# Patient Record
Sex: Female | Born: 1970 | Race: Black or African American | Hispanic: No | Marital: Married | Smoking: Former smoker
Health system: Southern US, Community
[De-identification: ages and names within clinical notes are randomized; demographics above are authoritative.]

## PROBLEM LIST (undated history)

## (undated) DIAGNOSIS — Z780 Asymptomatic menopausal state: Secondary | ICD-10-CM

## (undated) DIAGNOSIS — R809 Proteinuria, unspecified: Secondary | ICD-10-CM

## (undated) DIAGNOSIS — F419 Anxiety disorder, unspecified: Secondary | ICD-10-CM

## (undated) HISTORY — PX: CERVICAL BIOPSY  W/ LOOP ELECTRODE EXCISION: SUR135

## (undated) HISTORY — DX: Asymptomatic menopausal state: Z78.0

## (undated) HISTORY — DX: Proteinuria, unspecified: R80.9

## (undated) HISTORY — DX: Anxiety disorder, unspecified: F41.9

---

## 1999-07-08 ENCOUNTER — Ambulatory Visit (HOSPITAL_COMMUNITY): Admission: RE | Admit: 1999-07-08 | Discharge: 1999-07-08 | Payer: Self-pay | Admitting: *Deleted

## 1999-07-08 ENCOUNTER — Encounter: Payer: Self-pay | Admitting: *Deleted

## 1999-08-05 ENCOUNTER — Ambulatory Visit (HOSPITAL_COMMUNITY): Admission: RE | Admit: 1999-08-05 | Discharge: 1999-08-05 | Payer: Self-pay | Admitting: *Deleted

## 1999-08-05 ENCOUNTER — Encounter: Payer: Self-pay | Admitting: *Deleted

## 2000-04-04 ENCOUNTER — Encounter: Admission: RE | Admit: 2000-04-04 | Discharge: 2000-04-04 | Payer: Self-pay | Admitting: Family Medicine

## 2000-06-07 ENCOUNTER — Encounter: Admission: RE | Admit: 2000-06-07 | Discharge: 2000-06-07 | Payer: Self-pay | Admitting: Family Medicine

## 2000-06-28 ENCOUNTER — Encounter: Admission: RE | Admit: 2000-06-28 | Discharge: 2000-06-28 | Payer: Self-pay | Admitting: Family Medicine

## 2001-03-30 ENCOUNTER — Encounter: Admission: RE | Admit: 2001-03-30 | Discharge: 2001-03-30 | Payer: Self-pay | Admitting: Family Medicine

## 2001-10-01 ENCOUNTER — Encounter: Admission: RE | Admit: 2001-10-01 | Discharge: 2001-10-01 | Payer: Self-pay | Admitting: Family Medicine

## 2001-10-08 ENCOUNTER — Encounter: Admission: RE | Admit: 2001-10-08 | Discharge: 2002-01-06 | Payer: Self-pay | Admitting: Family Medicine

## 2001-12-11 ENCOUNTER — Encounter (INDEPENDENT_AMBULATORY_CARE_PROVIDER_SITE_OTHER): Payer: Self-pay | Admitting: *Deleted

## 2001-12-11 ENCOUNTER — Encounter: Admission: RE | Admit: 2001-12-11 | Discharge: 2001-12-11 | Payer: Self-pay | Admitting: Family Medicine

## 2001-12-12 ENCOUNTER — Encounter: Admission: RE | Admit: 2001-12-12 | Discharge: 2001-12-12 | Payer: Self-pay | Admitting: Sports Medicine

## 2001-12-12 ENCOUNTER — Encounter: Payer: Self-pay | Admitting: Sports Medicine

## 2002-01-07 ENCOUNTER — Encounter: Admission: RE | Admit: 2002-01-07 | Discharge: 2002-01-07 | Payer: Self-pay | Admitting: Family Medicine

## 2002-05-29 ENCOUNTER — Encounter: Admission: RE | Admit: 2002-05-29 | Discharge: 2002-05-29 | Payer: Self-pay | Admitting: Family Medicine

## 2003-01-16 ENCOUNTER — Encounter: Admission: RE | Admit: 2003-01-16 | Discharge: 2003-01-16 | Payer: Self-pay | Admitting: Family Medicine

## 2003-05-21 ENCOUNTER — Other Ambulatory Visit: Admission: RE | Admit: 2003-05-21 | Discharge: 2003-05-21 | Payer: Self-pay | Admitting: *Deleted

## 2003-10-14 ENCOUNTER — Encounter: Admission: RE | Admit: 2003-10-14 | Discharge: 2003-10-14 | Payer: Self-pay | Admitting: Family Medicine

## 2004-04-13 ENCOUNTER — Ambulatory Visit: Payer: Self-pay | Admitting: Family Medicine

## 2004-05-18 ENCOUNTER — Ambulatory Visit: Payer: Self-pay | Admitting: Sports Medicine

## 2004-11-11 ENCOUNTER — Ambulatory Visit: Payer: Self-pay | Admitting: Family Medicine

## 2005-02-23 ENCOUNTER — Ambulatory Visit: Payer: Self-pay | Admitting: Family Medicine

## 2005-02-28 ENCOUNTER — Ambulatory Visit: Payer: Self-pay | Admitting: Family Medicine

## 2005-05-25 ENCOUNTER — Ambulatory Visit: Payer: Self-pay | Admitting: Family Medicine

## 2005-08-23 ENCOUNTER — Ambulatory Visit: Payer: Self-pay | Admitting: Family Medicine

## 2006-01-05 ENCOUNTER — Ambulatory Visit: Payer: Self-pay | Admitting: Family Medicine

## 2006-05-08 ENCOUNTER — Encounter (INDEPENDENT_AMBULATORY_CARE_PROVIDER_SITE_OTHER): Payer: Self-pay | Admitting: *Deleted

## 2006-05-08 LAB — CONVERTED CEMR LAB

## 2006-05-12 ENCOUNTER — Ambulatory Visit: Payer: Self-pay | Admitting: Family Medicine

## 2006-05-12 ENCOUNTER — Encounter (INDEPENDENT_AMBULATORY_CARE_PROVIDER_SITE_OTHER): Payer: Self-pay | Admitting: Specialist

## 2006-10-05 DIAGNOSIS — E785 Hyperlipidemia, unspecified: Secondary | ICD-10-CM | POA: Insufficient documentation

## 2006-10-05 DIAGNOSIS — E669 Obesity, unspecified: Secondary | ICD-10-CM | POA: Insufficient documentation

## 2006-10-06 ENCOUNTER — Encounter (INDEPENDENT_AMBULATORY_CARE_PROVIDER_SITE_OTHER): Payer: Self-pay | Admitting: *Deleted

## 2006-10-12 ENCOUNTER — Ambulatory Visit: Payer: Self-pay | Admitting: Family Medicine

## 2006-10-12 ENCOUNTER — Encounter (INDEPENDENT_AMBULATORY_CARE_PROVIDER_SITE_OTHER): Payer: Self-pay | Admitting: Family Medicine

## 2006-10-12 ENCOUNTER — Telehealth (INDEPENDENT_AMBULATORY_CARE_PROVIDER_SITE_OTHER): Payer: Self-pay | Admitting: *Deleted

## 2006-10-12 DIAGNOSIS — E1110 Type 2 diabetes mellitus with ketoacidosis without coma: Secondary | ICD-10-CM | POA: Insufficient documentation

## 2006-10-23 ENCOUNTER — Telehealth (INDEPENDENT_AMBULATORY_CARE_PROVIDER_SITE_OTHER): Payer: Self-pay | Admitting: *Deleted

## 2006-12-06 ENCOUNTER — Telehealth (INDEPENDENT_AMBULATORY_CARE_PROVIDER_SITE_OTHER): Payer: Self-pay | Admitting: *Deleted

## 2006-12-19 ENCOUNTER — Encounter (INDEPENDENT_AMBULATORY_CARE_PROVIDER_SITE_OTHER): Payer: Self-pay | Admitting: Family Medicine

## 2006-12-19 ENCOUNTER — Ambulatory Visit: Payer: Self-pay | Admitting: Family Medicine

## 2006-12-19 LAB — CONVERTED CEMR LAB
ALT: 11 units/L
ALT: 11 units/L (ref 0–35)
AST: 14 units/L
AST: 14 units/L (ref 0–37)
Albumin: 4.2 g/dL
Albumin: 4.2 g/dL (ref 3.5–5.2)
Alkaline Phosphatase: 73 units/L
Alkaline Phosphatase: 73 units/L (ref 39–117)
BUN: 10 mg/dL
BUN: 10 mg/dL (ref 6–23)
CO2, serum: 23 mmol/L
CO2: 23 meq/L (ref 19–32)
Calcium: 9.3 mg/dL
Calcium: 9.3 mg/dL (ref 8.4–10.5)
Chloride, Serum: 108 mmol/L
Chloride: 108 meq/L (ref 96–112)
Cholesterol: 212 mg/dL
Cholesterol: 212 mg/dL — ABNORMAL HIGH (ref 0–200)
Creatinine, Ser: 0.62 mg/dL
Creatinine, Ser: 0.62 mg/dL (ref 0.40–1.20)
Glucose, Bld: 114 mg/dL
Glucose, Bld: 114 mg/dL — ABNORMAL HIGH (ref 70–99)
HDL: 49 mg/dL
HDL: 49 mg/dL (ref >39)
Hgb A1c MFr Bld: 7.5 %
LDL Cholesterol: 140 mg/dL
LDL Cholesterol: 140 mg/dL — ABNORMAL HIGH (ref 0–99)
Potassium, serum: 4.1 mmol/L
Potassium: 4.1 meq/L (ref 3.5–5.3)
Sodium, serum: 141 mmol/L
Sodium: 141 meq/L (ref 135–145)
TSH: 1.057 microintl units/mL
TSH: 1.057 microintl units/mL (ref 0.350–5.50)
Total Bilirubin: 0.6 mg/dL
Total Bilirubin: 0.6 mg/dL (ref 0.3–1.2)
Total CHOL/HDL Ratio: 4.3
Total Protein: 7.5 g/dL
Total Protein: 7.5 g/dL (ref 6.0–8.3)
Triglycerides: 116 mg/dL
Triglycerides: 116 mg/dL (ref <150)
VLDL: 23 mg/dL (ref 0–40)

## 2007-05-03 ENCOUNTER — Ambulatory Visit: Payer: Self-pay | Admitting: Family Medicine

## 2007-05-03 LAB — CONVERTED CEMR LAB: Hgb A1c MFr Bld: 7.6 %

## 2007-09-05 ENCOUNTER — Encounter (INDEPENDENT_AMBULATORY_CARE_PROVIDER_SITE_OTHER): Payer: Self-pay | Admitting: Family Medicine

## 2007-09-05 ENCOUNTER — Ambulatory Visit: Payer: Self-pay | Admitting: Family Medicine

## 2007-09-05 LAB — CONVERTED CEMR LAB
Hgb A1c MFr Bld: 10.7 %
Whiff Test: NEGATIVE

## 2007-09-06 ENCOUNTER — Encounter (INDEPENDENT_AMBULATORY_CARE_PROVIDER_SITE_OTHER): Payer: Self-pay | Admitting: Family Medicine

## 2007-09-06 LAB — CONVERTED CEMR LAB

## 2007-10-02 ENCOUNTER — Encounter: Payer: Self-pay | Admitting: *Deleted

## 2007-10-02 ENCOUNTER — Ambulatory Visit: Payer: Self-pay | Admitting: Family Medicine

## 2007-10-02 LAB — CONVERTED CEMR LAB: Beta hcg, urine, semiquantitative: NEGATIVE

## 2007-10-09 ENCOUNTER — Telehealth: Payer: Self-pay | Admitting: Family Medicine

## 2007-10-10 ENCOUNTER — Encounter: Payer: Self-pay | Admitting: Family Medicine

## 2007-10-15 ENCOUNTER — Encounter (INDEPENDENT_AMBULATORY_CARE_PROVIDER_SITE_OTHER): Payer: Self-pay | Admitting: *Deleted

## 2007-10-17 ENCOUNTER — Encounter (INDEPENDENT_AMBULATORY_CARE_PROVIDER_SITE_OTHER): Payer: Self-pay | Admitting: Family Medicine

## 2007-10-17 ENCOUNTER — Ambulatory Visit: Payer: Self-pay | Admitting: Family Medicine

## 2007-10-17 DIAGNOSIS — F341 Dysthymic disorder: Secondary | ICD-10-CM | POA: Insufficient documentation

## 2007-10-17 LAB — CONVERTED CEMR LAB
Blood in Urine, dipstick: NEGATIVE
Nitrite: NEGATIVE
Specific Gravity, Urine: 1.025
Urobilinogen, UA: 0.2

## 2007-11-06 LAB — CONVERTED CEMR LAB
AST: 11 units/L (ref 0–37)
Albumin: 4.2 g/dL (ref 3.5–5.2)
BUN: 14 mg/dL (ref 6–23)
Calcium: 8.9 mg/dL (ref 8.4–10.5)
Chloride: 104 meq/L (ref 96–112)
HDL: 44 mg/dL (ref >39)
Potassium: 3.8 meq/L (ref 3.5–5.3)
Sodium: 138 meq/L (ref 135–145)
Total Protein: 7.3 g/dL (ref 6.0–8.3)

## 2007-11-09 ENCOUNTER — Ambulatory Visit: Payer: Self-pay | Admitting: Obstetrics & Gynecology

## 2007-11-14 ENCOUNTER — Ambulatory Visit: Payer: Self-pay | Admitting: Obstetrics and Gynecology

## 2007-11-28 ENCOUNTER — Encounter: Payer: Self-pay | Admitting: Obstetrics and Gynecology

## 2007-11-28 ENCOUNTER — Ambulatory Visit (HOSPITAL_COMMUNITY): Admission: RE | Admit: 2007-11-28 | Discharge: 2007-11-28 | Payer: Self-pay | Admitting: Obstetrics and Gynecology

## 2007-11-28 ENCOUNTER — Ambulatory Visit: Payer: Self-pay | Admitting: Obstetrics and Gynecology

## 2007-12-19 ENCOUNTER — Ambulatory Visit: Payer: Self-pay | Admitting: Obstetrics and Gynecology

## 2007-12-27 ENCOUNTER — Ambulatory Visit: Payer: Self-pay | Admitting: Obstetrics & Gynecology

## 2007-12-27 ENCOUNTER — Encounter (INDEPENDENT_AMBULATORY_CARE_PROVIDER_SITE_OTHER): Payer: Self-pay | Admitting: Family Medicine

## 2008-05-16 ENCOUNTER — Ambulatory Visit: Payer: Self-pay | Admitting: Family Medicine

## 2008-05-16 ENCOUNTER — Encounter (INDEPENDENT_AMBULATORY_CARE_PROVIDER_SITE_OTHER): Payer: Self-pay | Admitting: Family Medicine

## 2008-05-16 LAB — CONVERTED CEMR LAB: Hgb A1c MFr Bld: 8.4 %

## 2008-05-18 LAB — CONVERTED CEMR LAB
ALT: 11 units/L (ref 0–35)
AST: 15 units/L (ref 0–37)
Alkaline Phosphatase: 80 units/L (ref 39–117)
Creatinine, Ser: 0.62 mg/dL (ref 0.40–1.20)
LDL Cholesterol: 122 mg/dL — ABNORMAL HIGH (ref 0–99)
Sodium: 138 meq/L (ref 135–145)
Total Bilirubin: 0.7 mg/dL (ref 0.3–1.2)
Total CHOL/HDL Ratio: 4
Total Protein: 7.2 g/dL (ref 6.0–8.3)
VLDL: 10 mg/dL (ref 0–40)

## 2008-05-29 ENCOUNTER — Encounter (INDEPENDENT_AMBULATORY_CARE_PROVIDER_SITE_OTHER): Payer: Self-pay | Admitting: Family Medicine

## 2008-07-11 ENCOUNTER — Encounter (INDEPENDENT_AMBULATORY_CARE_PROVIDER_SITE_OTHER): Payer: Self-pay | Admitting: Family Medicine

## 2008-12-05 LAB — CONVERTED CEMR LAB: Pap Smear: NORMAL

## 2008-12-30 ENCOUNTER — Ambulatory Visit: Payer: Self-pay | Admitting: Family Medicine

## 2008-12-30 ENCOUNTER — Encounter (INDEPENDENT_AMBULATORY_CARE_PROVIDER_SITE_OTHER): Payer: Self-pay | Admitting: Family Medicine

## 2008-12-30 LAB — CONVERTED CEMR LAB

## 2008-12-31 LAB — CONVERTED CEMR LAB
ALT: 10 units/L (ref 0–35)
AST: 15 units/L (ref 0–37)
BUN: 11 mg/dL (ref 6–23)
Creatinine, Ser: 0.57 mg/dL (ref 0.40–1.20)
Direct LDL: 132 mg/dL — ABNORMAL HIGH
Total Bilirubin: 0.6 mg/dL (ref 0.3–1.2)

## 2009-04-28 ENCOUNTER — Encounter: Payer: Self-pay | Admitting: Family Medicine

## 2009-04-28 ENCOUNTER — Ambulatory Visit: Payer: Self-pay | Admitting: Family Medicine

## 2009-04-28 LAB — CONVERTED CEMR LAB
Albumin: 4 g/dL (ref 3.5–5.2)
HDL: 42 mg/dL (ref >39)
LDL Cholesterol: 122 mg/dL — ABNORMAL HIGH (ref 0–99)
Total Protein: 7.3 g/dL (ref 6.0–8.3)
Triglycerides: 68 mg/dL (ref <150)
VLDL: 14 mg/dL (ref 0–40)

## 2009-07-09 ENCOUNTER — Ambulatory Visit: Payer: Self-pay | Admitting: Family Medicine

## 2010-02-23 ENCOUNTER — Telehealth: Payer: Self-pay | Admitting: Family Medicine

## 2010-03-08 ENCOUNTER — Ambulatory Visit: Payer: Self-pay | Admitting: Family Medicine

## 2010-03-08 ENCOUNTER — Encounter: Payer: Self-pay | Admitting: Family Medicine

## 2010-03-08 LAB — CONVERTED CEMR LAB
ALT: 13 units/L (ref 0–35)
AST: 14 units/L (ref 0–37)
Alkaline Phosphatase: 66 units/L (ref 39–117)
Bilirubin Urine: NEGATIVE
CO2: 26 meq/L (ref 19–32)
Hgb A1c MFr Bld: 7.8 %
Ketones, urine, test strip: NEGATIVE
MCV: 84.1 fL (ref 78.0–100.0)
Nitrite: NEGATIVE
Platelets: 242 10*3/uL (ref 150–400)
RDW: 14.5 % (ref 11.5–15.5)
Sodium: 139 meq/L (ref 135–145)
Specific Gravity, Urine: 1.02
TSH: 0.643 microintl units/mL (ref 0.350–4.500)
Total Bilirubin: 0.8 mg/dL (ref 0.3–1.2)
Total Protein: 7 g/dL (ref 6.0–8.3)
WBC: 8.1 10*3/uL (ref 4.0–10.5)

## 2010-03-09 ENCOUNTER — Encounter: Payer: Self-pay | Admitting: Family Medicine

## 2010-03-09 LAB — CONVERTED CEMR LAB: Microalb Creat Ratio: 6.5 mg/g (ref 0.0–30.0)

## 2010-05-27 ENCOUNTER — Ambulatory Visit: Payer: Self-pay | Admitting: Family Medicine

## 2010-05-27 ENCOUNTER — Telehealth: Payer: Self-pay | Admitting: *Deleted

## 2010-05-27 DIAGNOSIS — H9190 Unspecified hearing loss, unspecified ear: Secondary | ICD-10-CM | POA: Insufficient documentation

## 2010-05-27 LAB — CONVERTED CEMR LAB: Pap Smear: NEGATIVE

## 2010-09-07 NOTE — Progress Notes (Signed)
  Phone Note Outgoing Call   Call placed by: Jimmy Footman, CMA,  May 27, 2010 4:04 PM Call placed to: Patient Summary of Call: LVM for patient ot call back office to inform of appt @ ENT. information on the order  Follow-up for Phone Call        spoke with patient informed her of appt Follow-up by: Jimmy Footman, CMA,  May 27, 2010 4:10 PM

## 2010-09-07 NOTE — Assessment & Plan Note (Signed)
Summary: cpe/pap,tcb   Vital Signs:  Patient profile:   40 year old female Height:      66 inches Weight:      180.4 pounds BMI:     29.22 Temp:     98.3 degrees F oral Pulse rate:   80 / minute BP sitting:   129 / 81  (right arm) Cuff size:   regular  Vitals Entered By: Bethena Midget (May 27, 2010 9:22 AM) CC: cpe Is Patient Diabetic? Yes Did you bring your meter with you today? Yes Pain Assessment Patient in pain? no        Primary Care Provider:  Renold Don MD  CC:  cpe.  History of Present Illness: Here for yearly Pap smear:  1. Pap smear:  Last one was 1 year ago at [facility] office Dr. Nicholas Lose.  Does have history of abnormal one.  History of loop surgery.    2.  Difficulty hearing:  Also complains of drainage from ears while she is sleeping.  Complains also of ear blockage.  Has been using Q-tips and ear wax cleaning liquid.  Has history of tympanostomy tubes while a child with decreased hearing.  Now states hearing comes and goes.  Duration of 1 month.    3.  Diabetes:  Tests blood sugars about 1 monthly.  Metformin twice daily.  Has had diabetes since age 49.  Blood sugars have been running about 160s, checks them in AM.    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 Take 1 Tablet By Mouth Every Morning 2)  Enalapril Maleate 5 Mg Tabs (Enalapril Maleate) .... Take 1 Tablet By Mouth Once A Day 3)  Zocor 80 Mg  Tabs (Simvastatin) .Marland Kitchen.. 1 Tablet By Mouth Daily - This Is A New  Dose (You Will Need Repeat Labwork 6-8 Weeks After Starting) 4)  Glucophage 1000 Mg  Tabs (Metformin Hcl) .Marland Kitchen.. 1 Tablet By Mouth Two Times A Day With Food 5)  Glucometer, Test Strips, Lancets .... Test Blood Sugar 1-2 Times Daily - Disp Qs of Each  Allergies (verified): No Known Drug Allergies  Past History:  Past medical, surgical, family and social histories (including risk factors) reviewed, and no changes noted (except as noted below).  Past Medical History: Reviewed history from 12/30/2008 and no changes required. 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 Nicholas Lose  Past Surgical History: Reviewed history from 09/05/2007 and no changes required. Bilateral Tubal Ligation - G5P5 - 01/05/2006  Family History: Reviewed history from 10/17/2007 and no changes required. Mother with diabetes, depression One brother with DM, Bipolar disorder Sister with HTN Two sisters with DM, Depression No family hx of Breast Cancer  Social History: Reviewed history from 07/09/2009 and no changes required. Lives at home with  3 children age 52, 48, 67 and husband.  Oldest, daughter, moving out after graduating [employer] [employer]  ; No tobacco, social alcohol; Works [employer] - 3rd shift; Works at [employer].  Exercising 45 min daily at gym.  Review of Systems       ROS:  no headaches, pre-syncopal or syncopal episodes, chest pain, palpitations, shortness of breath or dyspnea, abdominal pain, diarrhea or constipation, melena, hematochezia, lower extremity swelling.    Physical Exam  General:  Vital signs reviewed. Well-developed, well-nourished patient in NAD.  Awake and cooperative  Ears:  previous tympanostomy tube scars noted.  Right ear within normal limits.  Left ear with fluid behind typanic membrane.   No redness or erythma noted.   Lungs:  clear to auscultation bilaterally without wheezing, rales, or rhonchi.  Normal work of breathing  Heart:  Regular rate and rhythm without murmur, rub, or gallop.  Normal S1/S2  Abdomen:  soft/nondistended/nontender.  Bowel sounds present and normoactive.  No organomegaly noted.   Genitalia:  Pelvic Exam:        External: normal female genitalia without lesions or masses        Vagina: normal without lesions or masses        Cervix: normal without lesions or masses        Adnexa: normal bimanual exam without masses or fullness        Uterus: normal by palpation        Pap smear: performed   Impression & Recommendations:  Problem # 1:  DIABETES-TYPE 2 (ICD-250.00) Assessment Improved A1C much improved.  No changes to medications today.  May eventually refer to Dr. Gerilyn Pilgrim for nutritiont counseling depending on future A1C levels.  Patient deferred today.  Will follow.   Her updated medication list for this problem includes:    Aspirin Ec 81 Mg Tbec (Aspirin) .Marland Kitchen... Take 1 tablet by mouth every morning    Enalapril Maleate 5 Mg Tabs (Enalapril maleate) .Marland Kitchen... Take 1 tablet by mouth once a day    Glucophage 1000 Mg Tabs (Metformin hcl) .Marland Kitchen... 1 tablet by mouth two times a day with food  Orders: [facility]- Est  Level 4 ([address])  Problem # 2:  HEARING DEFICIT (ICD-389.9) Will refer to ENT.  No obvious deformities on exam, but patient with some fluid behind eardrum, possibly chronic in nature.  With history of previous tympanostomy tubes, hearing deficts as a child (questionably resolved per patient?) and new onset hearing deficits, will refer to ENT.   Orders: ENT Referral (ENT)  Problem # 3:  SCREENING FOR MALIGNANT NEOPLASM OF THE CERVIX (ICD-V76.2) Pap performed.  Await results.  Previous CIN I, need yearly paps for now.   Orders: Pap Smear-[facility] ([id number]) [facility]- Est  Level 4 ([address])  Complete Medication List: 1)  Aspirin Ec 81 Mg Tbec (Aspirin) ....  Take 1 tablet by mouth every morning 2)  Enalapril Maleate 5 Mg Tabs (Enalapril maleate) .... Take 1 tablet by mouth once a day 3)  Zocor 80 Mg Tabs (Simvastatin) .Marland Kitchen.. 1 tablet by mouth daily - this is a new dose (you will need repeat labwork 6-8 weeks after starting) 4)  Glucophage 1000 Mg Tabs (Metformin hcl) .Marland Kitchen.. 1 tablet by mouth two times a day with food 5)  Glucometer, Test Strips, Lancets  .... Test blood sugar 1-2 times daily - disp qs of each  Patient Instructions: 1)  Keep working hard at exercise and eating right.   2)  Your last A1C was  7.8, which is MUCH improved over the 9 from last year.  Your ultimate goal would probably be less than 7. 3)  We will let you know the results of your pap smear.   4)  See me again in 3-4 months.  5)  It was good to see you today.    Orders Added: 1)  Pap Smear-[facility] [[id number]] 2)  [facility]- Est  Level 4 [[address]] 3)  ENT Referral [ENT]

## 2010-09-07 NOTE — Letter (Signed)
Summary: Generic Letter  [facility]  [address]   [place], [place] [address]   Phone: [phone]  Fax: [phone]    03/09/2010  Marisa Davis [address] [place], [place]  [address]  Dear [name],  It was good to see you in clinic the other day.    In your labwork, your bloodwork all came back normal.  Your kidney function tests and electrolytes were also normal.  We tested your thyroid function as well, which was also normal.  All this is good.  For your diabetes, your blood sugar was high at 236 and your A1C was 7.8.  This is much lower than your most recent A1C which was 9.  Keep up the hard work with diet and execise as well as taking the Metformin to keep the A1C low.  This will help prevent many of the complications of diabetes we discussed in clinic.  Also, you had no protein in your urine.  This is also very reassuring.    If you have any questions regarding your lab work, please call our office.  Have a good rest of the week.     Sincerely,   Renold Don MD   Appended Document: Generic Letter mailed

## 2010-09-07 NOTE — Progress Notes (Signed)
Summary: Rx Req  Phone Note Call from Patient Call back at [facility] Phone [phone]   Caller: Patient Summary of Call: Wondering if she can get samples of Gluophage. Initial call taken by: Clydell Hakim,  February 23, 2010 10:45 AM  Follow-up for Phone Call        advise patient that we no longer have samples. she then ask for refill to be sent to [facility]. will forward message to MD. Follow-up by: Theresia Lo RN,  February 23, 2010 10:53 AM    Prescriptions: GLUCOPHAGE 1000 MG  TABS (METFORMIN HCL) 1 tablet by mouth two times a day with food  #60 x 3   Entered and Authorized by:   Renold Don MD   Signed by:   Renold Don MD on 02/23/2010   Method used:   Electronically to        [facility] [id number] (retail)       [address] [place], [place]  [address]       Ph: [phone]       Fax: [phone]   RxID:   [id number]   Appended Document: Rx Req Pt informed that refill was sent

## 2010-09-07 NOTE — Assessment & Plan Note (Signed)
Summary: f/up,tcb   Vital Signs:  Patient profile:   40 year old female Height:      66 inches Weight:      181 pounds BMI:     29.32 Temp:     98.6 degrees F oral Pulse rate:   109 / minute BP sitting:   114 / 72  (left arm) Cuff size:   regular  Vitals Entered By: Jimmy Footman, CMA (March 08, 2010 10:55 AM) CC: Needs rx filled Is Patient Diabetic? Yes Did you bring your meter with you today? No Pain Assessment Patient in pain? no        Primary Care Provider:  Renold Don MD  CC:  Needs rx filled.  History of Present Illness: CC:  Patient states pharmacist will not refill medications without doctor visit  1.  Diabetes - Patient states she is prescribed Metformin twice daily but only takes once.  Does not regularly check her blood sugars.  When she does they usually run 150-180s.  Denies dizziness, shakiness, confusion, polyuria, polydipsia, or nocturia.  Denies recent infection or complications.   Attempting to control with diet and regular excercise 3-4 x weekly.    2.  HLD:  No muscle aches or pain.  Taking Lipitor regularly.  No abdominal pain.    3.  Proteinuria:  States she has had this in past.  A1C as high as 14 in past as well.  Would like to have her kidney function tested today.    ROS:  no headaches, pre-syncopal or syncopal episodes, chest pain, palpitations, shortness of breath or dyspnea, abdominal pain, diarrhea or constipation, melena, hematochezia, lower extremity swelling.    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 Take 1 Tablet By Mouth Every Morning 2)  Enalapril Maleate 5 Mg Tabs (Enalapril Maleate) .... Take 1 Tablet By Mouth Once A Day 3)  Zocor 80 Mg  Tabs (Simvastatin) .Marland Kitchen.. 1 Tablet By Mouth Daily - This Is A New Dose (You Will Need Repeat Labwork 6-8 Weeks After Starting) 4)  Glucophage 1000 Mg  Tabs (Metformin Hcl) .Marland Kitchen.. 1 Tablet By Mouth Two Times A Day With Food 5)  Glucometer, Test Strips, Lancets .... Test Blood Sugar 1-2 Times Daily - Disp Qs of Each  Allergies (verified): No Known Drug Allergies  Past History:  Past medical, surgical, family and social histories (including risk factors) reviewed, and no changes noted (except as noted below).  Past Medical History: Reviewed history from 12/30/2008 and no changes required. 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 Nicholas Lose  Past Surgical History: Reviewed history from 09/05/2007 and no changes required. Bilateral Tubal Ligation - G5P5 - 01/05/2006  Family History: Reviewed history from 10/17/2007 and no changes required. Mother with diabetes, depression One  brother with DM, Bipolar disorder Sister with HTN Two sisters with DM, Depression No family hx of Breast Cancer  Social History: Reviewed history from 07/09/2009 and no changes required. Lives at home with 3 children age 51, 79, 49 and husband.  Oldest, daughter, moving out after graduating [employer]  ; No tobacco, social alcohol; Works [employer] - 3rd shift; Works at [employer].  Exercising 45 min daily at gym.  Physical Exam  General:  Vital signs reviewed. Well-developed, well-nourished patient in NAD.  Awake and cooperative  Eyes:  vision grossly intact, pupils equal, pupils round, and pupils reactive to  light.   Mouth:  oral mucosa moist Lungs:  clear to auscultation bilaterally without wheezing, rales, or rhonchi.  Normal work of breathing  Heart:  Regular rate and rhythm.  Grade II/VI systolic ejection murmur heard best at upper sternal borders Abdomen:  soft/nondistended/nontender.  Bowel sounds present and normoactive.  No organomegaly noted.   Pulses:  Bilateral distal pulses present and +2 Extremities:  No clubbing, cyanosis, edema, or deformity noted with normal full range of motion of all joints.  No edema noted bilateral lower extremities.   Neurologic:  No cranial nerve deficits noted. Station and gait are normal. Plantar reflexes are down-going bilaterally. DTRs are symmetrical throughout. Sensory, motor and coordinative functions appear intact.   Impression & Recommendations:  Problem # 1:  DIABETES-TYPE 2 (ICD-250.00) Assessment Improved A1C 7.8 today.  Last recorded A1C was 9.4.  Improvement with diet and exercise per patient.  Recommended she continue to take her Glucophage as directed twice daily.  Denies any abdominal pain or nausea with metformin.  Will check liver function tests as she is on both metformin and statin.  Also CBC. Her updated medication list for this problem includes:    Aspirin Ec 81 Mg Tbec (Aspirin) .Marland Kitchen... Take 1 tablet by mouth every morning    Enalapril Maleate 5 Mg Tabs (Enalapril maleate) .Marland Kitchen... Take 1 tablet by mouth once a day    Glucophage 1000 Mg Tabs (Metformin hcl) .Marland Kitchen... 1 tablet by mouth two times a day with food  Orders: A1C-[facility] ([address]) Comp Met-[facility] ([id number]) CBC-[facility] (9[address]78) TSH-[facility] ([id number]) [facility]- Est  Level 4 ([address])  Problem # 2:  PROTEINURIA (ICD-791.0) Assessment: Comment Only Plan to obtain UA, microalbumin, protein/creatinine ration as patient has history of significant proteinuria in past.  Unable to find history of this in [facility], states it was from previous physician.  Will follow-up with lab results.    Orders: Urinalysis-[facility] (00000) UA Microalbumin-[facility] ([address]) Urine Protein Ratio- [facility] ([id number]) Urine Creatinine-[facility] ([id number]) [facility]- Est  Level 4 ([address])  Problem # 3:  HYPERLIPIDEMIA (ICD-272.4) Plan to check CMET today.  Will follow-up on lipid panel when patient is fasting.   Her updated medication list for this problem includes:    Zocor 80 Mg Tabs (Simvastatin) .Marland Kitchen... 1 tablet by mouth daily - this is a new dose (you will need repeat labwork 6-8 weeks after starting)  Orders: Comp Met-[facility] ([id number]) [facility]- Est  Level 4 ([address])  Complete Medication List: 1)  Aspirin Ec 81 Mg Tbec (Aspirin) .... Take 1 tablet by mouth every morning 2)  Enalapril Maleate 5 Mg Tabs (Enalapril maleate) .... Take 1 tablet by mouth once a day 3)  Zocor 80 Mg Tabs (Simvastatin) .Marland Kitchen.. 1 tablet by mouth daily - this is a new dose (you will need repeat labwork 6-8 weeks after starting) 4)  Glucophage 1000 Mg Tabs (Metformin hcl) .Marland Kitchen.. 1 tablet by mouth  two times a day with food 5)  Glucometer, Test Strips, Lancets  .... Test blood sugar 1-2 times daily - disp qs of each  Patient Instructions: 1)  We are going to check your urine for protein today.   2)  We will also look at your liver and kidney function as well as your A1C.   3)  I will mail these results to you if they are okay. 4)  If you are having problems with just your kidney, we may need to refer you to a kidney doctor. 5)  If you are having problems with your kidneys because of diabetes, we may need to refer you to an endocrinologist (diabetes doctor).   6)  Keep working hard at exercise and eating right, come back in 4-6 months for another check-up. Prescriptions: ENALAPRIL MALEATE 5 MG TABS (ENALAPRIL MALEATE) Take 1 tablet by mouth once a day  #34 x 5   Entered and Authorized by:   Renold Don MD   Signed by:   Renold Don MD on 03/08/2010   Method used:   Electronically to        [facility] [address] (retail)       [address] [place], [place]  [address]       Ph: [phone]       Fax: [phone]   RxID:   [id number] ZOCOR 80 MG  TABS (SIMVASTATIN) 1 tablet by mouth daily - this is a new dose (you will need repeat labwork 6-8 weeks after starting)  #34 x 5   Entered and Authorized by:   Renold Don MD   Signed by:   Renold Don MD on 03/08/2010   Method used:   Electronically to        [facility] [address] (retail)       [address]       [place], [place]  [address]       Ph: [phone]       Fax: [phone]   RxID:   [id number]   Laboratory Results   Urine Tests  Date/Time Received: March 08, 2010 11:10 AM  Date/Time Reported: March 08, 2010 11:19 AM   Routine Urinalysis   Color: yellow Appearance: Clear Glucose: negative   (Normal Range: Negative) Bilirubin: negative   (Normal Range: Negative) Ketone: negative   (Normal Range: Negative) Spec. Gravity: 1.020   (Normal Range: 1.003-1.035) Blood: negative   (Normal Range: Negative) pH: 6.0   (Normal Range: 5.0-8.0) Protein: negative   (Normal Range: Negative) Urobilinogen: 0.2   (Normal Range: 0-1) Nitrite: negative   (Normal Range: Negative) Leukocyte Esterace: negative   (Normal Range: Negative)    Comments: ...............test performed by......Marland KitchenBonnie A. [place], MLS (ASCP)cm   Blood Tests   Date/Time Received: March 08, 2010 11:37 AM  Date/Time Reported: March 08, 2010 1:55 PM   HGBA1C: 7.8%   (Normal Range: Non-Diabetic - 3-6%   Control Diabetic - 6-8%)  Comments: ...............test performed by......Marland KitchenBonnie A. [place], MLS (ASCP)cm        Appended Document: MALBU report    Lab Visit  Laboratory Results   Urine Tests  Date/Time Received: March 09, 2010 11:10 AM  Date/Time Reported: March 09, 2010 2:41 PM   Microalbumin (urine): 30 mg/L Creatinine: 200mg /dL  A:C Ratio <88 Normal  Comments: urine sent for quant MALBU prot/creat  ratio ...............test performed by......Marland KitchenBonnie A. [place], MLS (ASCP)cm    Orders Today: Miscellaneous Lab Charge-[facility] [id number]

## 2010-09-17 ENCOUNTER — Encounter: Payer: Self-pay | Admitting: *Deleted

## 2010-11-04 ENCOUNTER — Ambulatory Visit (INDEPENDENT_AMBULATORY_CARE_PROVIDER_SITE_OTHER): Payer: BC Managed Care – PPO | Admitting: Family Medicine

## 2010-11-04 ENCOUNTER — Encounter: Payer: Self-pay | Admitting: Family Medicine

## 2010-11-04 VITALS — BP 110/78 | HR 64 | Temp 98.4°F | Wt 174.0 lb

## 2010-11-04 DIAGNOSIS — E785 Hyperlipidemia, unspecified: Secondary | ICD-10-CM

## 2010-11-04 DIAGNOSIS — M79604 Pain in right leg: Secondary | ICD-10-CM

## 2010-11-04 DIAGNOSIS — H9190 Unspecified hearing loss, unspecified ear: Secondary | ICD-10-CM

## 2010-11-04 DIAGNOSIS — M79609 Pain in unspecified limb: Secondary | ICD-10-CM

## 2010-11-04 DIAGNOSIS — M25511 Pain in right shoulder: Secondary | ICD-10-CM

## 2010-11-04 DIAGNOSIS — E119 Type 2 diabetes mellitus without complications: Secondary | ICD-10-CM

## 2010-11-04 DIAGNOSIS — M25519 Pain in unspecified shoulder: Secondary | ICD-10-CM

## 2010-11-04 LAB — BASIC METABOLIC PANEL
CO2: 24 mEq/L (ref 19–32)
Calcium: 9.3 mg/dL (ref 8.4–10.5)
Chloride: 104 mEq/L (ref 96–112)
Glucose, Bld: 93 mg/dL (ref 70–99)
Sodium: 139 mEq/L (ref 135–145)

## 2010-11-04 LAB — POCT GLYCOSYLATED HEMOGLOBIN (HGB A1C): Hemoglobin A1C: 7.4

## 2010-11-04 MED ORDER — METFORMIN HCL 1000 MG PO TABS: 1000.0000 mg | ORAL_TABLET | Freq: Two times a day (BID) | ORAL | Status: DC

## 2010-11-04 MED ORDER — ENALAPRIL MALEATE 5 MG PO TABS: 5.0000 mg | ORAL_TABLET | Freq: Every day | ORAL | Status: DC

## 2010-11-04 NOTE — Assessment & Plan Note (Signed)
Diagnosed with "water on the ear" by ENT. Treated with unknown powder prescribed by ENT.   Resolved.

## 2010-11-04 NOTE — Progress Notes (Signed)
  Subjective:    Patient ID: Marisa Davis, female    DOB: 03-Mar-1971, 40 y.o.   MRN: [id number]  HPI 1.  DM II:  Currently on Metformin.  Prescribed Enalapril, but has not been taking this because BP's normal and last microalbumin check was normal. No adverse effects from medications.  No hypoglycemic events.  No paresthesia or peripheral nerve pain.  Does not measure blood sugars at home.   Last A1C was 7.8.  2.  Right hip pain:  Present for several months.  Describes pain in lateral aspect of thigh, near greater trochanter.  Painful at times when she ambulates.  Has not tried anything for relief.  Pain is 3-4/10.  States that pain really is not bad, she just wanted to mention it to make sure it wasn't anything bad.  Described as dull aching pain.  No numbness or tingling.  Not getting better or worse, about the same since it first appeared.    3.  Right shoulder pain:  Also with pain in her right shoulder.  Notices when she tries to lift something heavy.  No weakness. Is not exercising, no recent trauma to explain injury.  Not swollen or red.  Describes as dull ache.  Non-radiating.  Has not tried anything for relief.     Review of Systems See HPI above for review of systems.       Objective:   Physical Exam Gen:  Alert, cooperative patient who appears stated age in no acute distress.  Vital signs reviewed. Cardiac:  Regular rate and rhythm without murmur auscultated.  Good S1/S2. Lungs:  Clear to auscultation bilaterally.  No wheezing, rales, rhonchi noted.   MSK:  Pain when attempting Apley test with Right arm.  Able to "scratch her back" without pain.  No passive range of motion pain, mild tenderness with active ROM.  No strength deficits.  No pain with active/passive ROM testing of RLE.  No numbness, loss of sensation.  No point tenderness on greater trochanter.  No bruising noted.   Neuro:  No focal deficits noted.        Assessment & Plan:

## 2010-11-04 NOTE — Assessment & Plan Note (Addendum)
Reviewed last lipid panel, LDL 122. DMII would place her at LDL goal <100. Patient would like to attempt trial of better eating and exercise before adding medications. She was on statin in past, no longer taking.   FU in 6 months, will obtain FLP at that time.

## 2010-11-04 NOTE — Assessment & Plan Note (Signed)
As with leg pain, to continue exercise. Discussed she does not want to limit movement to prevent frozen shoulder in diabetic.   If worsening or no improvement, to call or return to clinic.

## 2010-11-04 NOTE — Patient Instructions (Signed)
Keep taking the Metformin. I'll send in the Enalapril to [facility]. We'll check your labwork today. Your A1C was 7.4 today.  Your goal should be below 7.   For your shoulder, make sure you keep exercising and moving it.

## 2010-11-05 ENCOUNTER — Telehealth: Payer: Self-pay | Admitting: *Deleted

## 2010-11-05 ENCOUNTER — Encounter: Payer: Self-pay | Admitting: Family Medicine

## 2010-11-05 NOTE — Telephone Encounter (Signed)
Received fax from pharmacy  . MD sent in RX for metformin 1000 mg twice daily but only sent in # 30 tabs per month . called pharmacy and advised may give # 60 tabs.

## 2010-12-21 NOTE — Op Note (Signed)
NAME:  Marisa Davis, Marisa Davis               ACCOUNT NO.:  [id number]   MEDICAL RECORD NO.:  [id number]          PATIENT TYPE:  AMB   LOCATION:  SDC                           FACILITY:  [facility]   PHYSICIAN:  Phil D. Okey Dupre, M.D.     DATE OF BIRTH:  March 27, 1971   DATE OF PROCEDURE:  11/28/2007  DATE OF DISCHARGE:                               OPERATIVE REPORT   PROCEDURE:  Cold knife conization of the cervix.   PREOPERATIVE DIAGNOSIS:  Severe cervical dysplasia, CIN II-III.   POSTOPERATIVE DIAGNOSIS:  Pending pathology report.   SURGEON:  Dr. Okey Dupre.   ANESTHESIA:  General plus local.   SPECIMENS TO PATHOLOGY:  Cervical cone biopsy.   ESTIMATED BLOOD LOSS:  20 mL.   POSTOPERATIVE CONDITION:  Satisfactory.   DESCRIPTION OF PROCEDURE:  Under satisfactory general anesthesia with  the patient in the dorsal lithotomy position, the perineum and vagina  were prepped and draped in the usual sterile manner.  Bimanual pelvic  examination under anesthesia revealed the uterus of normal size, shape  and consistency.  Adnexa was normal.  A weighted speculum was placed in  the posterior fourchette of the vagina through a marital introitus.  BUS  within normal limits.  Vagina is clean and well rugated.  The cervix was  parous with an inflamed area around the entire cervical parous os,  approximately 1/2 cm from the opening into the external os.  Ten mL of  1% Xylocaine was injected into each of the paracervical areas at 8 and 4  o'clock for further patient comfort postop and #1 chromic catgut suture  figure-of-eights were placed in each of the lateral angles of the  cervix, approximately 3 cm from the distal end for hemostasis and  traction.  These were held long.  The entire circumference of the cervix  then just above those approximately 3 cm from the distal end, we  superficially ran a pursestring starting at 12 o'clock and going around  with #1 chromic very superficially to 12 o'clock coming around the  entire cervix.  This was snugly tied down and cut short.  The cervical  cone was then taken with a #11 knife blade 0.5 cm from the external  cervical os and extending up about 1.5 cm into the cervix.  The area  appeared dry but was run with a continuous running locked #1 chromic  baseball type of stitch around the entire circumference.  Once this was  done, that was cut short as were the  original figure-of-eight sutures that were held for traction.  A small  piece of Surgicel was placed up in the cervix.  No bleeding occurred at  the end of procedure.  The total blood loss was about 20 mL.  The  patient transferred to the recovery room in satisfactory condition  having tolerated the procedure well.      Phil D. Okey Dupre, M.D.  Electronically Signed     PDR/MEDQ  D:  11/28/2007  T:  11/28/2007  Job:  [id number]

## 2010-12-21 NOTE — Group Therapy Note (Signed)
NAME:  Marisa Davis, Marisa Davis NO.:  [id number]   MEDICAL RECORD NO.:  [id number]          PATIENT TYPE:  WOC   LOCATION:  [facility]                   FACILITY:  [facility]   PHYSICIAN:  Argentina Donovan, MD        DATE OF BIRTH:  August 02, 1971   DATE OF SERVICE:  11/14/2007                                  CLINIC NOTE   The patient is a 40 year old gravida 3, para 3-00-3 with severe  dysplasia who came in for a LEEP procedure.  On examination the cervix  lesion was too large to safely do in the clinic, and I am going to  schedule her to do this in the operating room as a conization.           ______________________________  Argentina Donovan, MD     PR/MEDQ  D:  11/14/2007  T:  11/14/2007  Job:  [id number]

## 2010-12-27 ENCOUNTER — Encounter: Payer: Self-pay | Admitting: Family Medicine

## 2011-04-19 ENCOUNTER — Telehealth: Payer: Self-pay | Admitting: Family Medicine

## 2011-04-19 NOTE — Telephone Encounter (Signed)
Please call patient back with results.

## 2011-04-19 NOTE — Telephone Encounter (Signed)
LVM for patient to call back. Her last A1C was 11/04/2010 and it was 7.4. Patient is due for A1C again she has not had one drawn in 6 months nor has had an office visit since then also

## 2011-04-21 NOTE — Telephone Encounter (Signed)
LVM for patient to call back. ?

## 2011-04-25 NOTE — Telephone Encounter (Signed)
LVM informing her of the message below

## 2011-05-03 LAB — CBC
Hemoglobin: 12
RBC: 4.8
WBC: 6.4

## 2011-05-03 LAB — URINALYSIS, ROUTINE W REFLEX MICROSCOPIC
Glucose, UA: NEGATIVE
Nitrite: NEGATIVE
Specific Gravity, Urine: >1.03 — ABNORMAL HIGH
pH: 6

## 2011-05-03 LAB — BASIC METABOLIC PANEL
Chloride: 104
Creatinine, Ser: 0.48
GFR calc Af Amer: >60
GFR calc non Af Amer: >60
Potassium: 3.6

## 2011-05-16 ENCOUNTER — Ambulatory Visit (INDEPENDENT_AMBULATORY_CARE_PROVIDER_SITE_OTHER): Payer: BC Managed Care – PPO | Admitting: Family Medicine

## 2011-05-16 ENCOUNTER — Other Ambulatory Visit (HOSPITAL_COMMUNITY)
Admission: RE | Admit: 2011-05-16 | Discharge: 2011-05-16 | Disposition: A | Discharge status: Home / Self Care | Payer: BC Managed Care – PPO | Source: Ambulatory Visit | Attending: Family Medicine | Admitting: Family Medicine

## 2011-05-16 ENCOUNTER — Encounter: Payer: Self-pay | Admitting: Family Medicine

## 2011-05-16 VITALS — BP 119/78 | HR 80 | Temp 97.4°F | Ht 66.0 in | Wt 172.5 lb

## 2011-05-16 DIAGNOSIS — Z01419 Encounter for gynecological examination (general) (routine) without abnormal findings: Secondary | ICD-10-CM | POA: Insufficient documentation

## 2011-05-16 DIAGNOSIS — E119 Type 2 diabetes mellitus without complications: Secondary | ICD-10-CM

## 2011-05-16 DIAGNOSIS — Z124 Encounter for screening for malignant neoplasm of cervix: Secondary | ICD-10-CM

## 2011-05-16 DIAGNOSIS — M79609 Pain in unspecified limb: Secondary | ICD-10-CM

## 2011-05-16 DIAGNOSIS — M76899 Other specified enthesopathies of unspecified lower limb, excluding foot: Secondary | ICD-10-CM

## 2011-05-16 DIAGNOSIS — M79604 Pain in right leg: Secondary | ICD-10-CM

## 2011-05-16 DIAGNOSIS — M7060 Trochanteric bursitis, unspecified hip: Secondary | ICD-10-CM

## 2011-05-16 LAB — POCT GLYCOSYLATED HEMOGLOBIN (HGB A1C): Hemoglobin A1C: 7.4

## 2011-05-16 NOTE — Progress Notes (Signed)
  Subjective:    Patient ID: Marisa Davis, female    DOB: [birth year], 40 y.o.   MRN: [id number]  HPI  1.  Diabetes:  Currently on Metformin.   No adverse effects from medication.  No hypoglycemic events.  No paresthesia or peripheral nerve pain.  Measures blood sugars at home every:    Does not measure at home.  Last A1C before today was also 7.4. Lab Results  Component Value Date   HGBA1C 7.4 05/16/2011   2.  Right hip pain:  Worse when lying in bed at night or lying on Right side.  Describes as aching pain located medial side of right leg, described as right hip. Pain is bad when standing and walking. Pain worse in the day. Not tried taking anything for relief. Not doing much regarding exercise or stretching apply.  3.  Preventative medicine:  Patient here for Pap smear today.  Review of Systems See HPI above for review of systems.       Objective:   Physical Exam Gen:  Alert, cooperative patient who appears stated age in no acute distress.  Vital signs reviewed. HEENT:  [place]/AT.  EOMI, PERRL.  MMM, tonsils non-erythematous, non-edematous.  External ears WNL, Bilateral TM's normal without retraction, redness or bulging.  Neck: No masses or thyromegaly or limitation in range of motion.  No cervical lymphadenopathy. Pulm:  Clear to auscultation bilaterally with good air movement.  No wheezes or rales noted.   Cardiac:  Regular rate and rhythm without murmur auscultated.  Good S1/S2. Abd:  Soft/nondistended/nontender.  Good bowel sounds throughout all four quadrants.  No masses noted.  GYN:  External genitalia within normal limits.  Vaginal mucosa pink, moist, normal rugae.  Nonfriable cervix without lesions, no discharge or bleeding noted on speculum exam.  Bimanual exam revealed normal, nongravid uterus.  No cervical motion tenderness. No adnexal masses bilaterally.   MSK:  Tender to palpation at right greater trochanter. There is some decreased flexibility of right hamstring. Straight  leg raise testing did not reveal any lower back pain. Strength 5 out of 5 bilateral lower extremities.        Assessment & Plan:

## 2011-05-16 NOTE — Patient Instructions (Addendum)
Your A1C was 7.4, which was exactly what it was last time in March.   This is good, though for someone healthy like you it might be possible to get it below 7, which if good for having the least amount of complications over the years.   Keep working hard at diet and exercise.   It was good to see you today.    Trochanteric Bursitis You have hip pain due to trochanteric bursitis. Bursitis means that the sack near the outside of the hip is filled with fluid and inflamed. This sack is made up of protective soft tissue. The pain from trochanteric bursitis can be severe and keep you from sleep. It can radiate to the buttocks or down the outside of the thigh to the knee. The pain is almost always worse when rising from the seated or lying position and with walking. Pain can improve after you take a few steps. It happens more often in people with hip joint and lumbar spine problems, such as arthritis or previous surgery. Very rarely the trochanteric bursa can become infected, and antibiotics and/or surgery may be needed. Treatment often includes an injection of local anesthetic mixed with cortisone medicine. This medicine is injected into the area where it is most tender over the hip. Repeat injections may be necessary if the response to treatment is slow. You can apply ice packs over the tender area for 30 minutes every 2 hours for the next few days. Anti-inflammatory and/or narcotic pain medicine may also be helpful. Limit your activity for the next few days if the pain continues. See your caregiver in 5-10 days if you are not greatly improved.   STRETCHING EXERCISES - Trochantic Bursitis  These exercises may help you when beginning to rehabilitate your injury. Your symptoms may resolve with or without further involvement from your physician, physical therapist or athletic trainer. While completing these exercises, remember:    Restoring tissue flexibility helps normal motion to return to the joints. This  allows healthier, less painful movement and activity.   An effective stretch should be held for at least 30 seconds.   A stretch should never be painful. You should only feel a gentle lengthening or release in the stretched tissue.  STRETCH - Iliotibial Band   On the floor or bed, lie on your side so your injured leg is on top. Bend your knee and grab your ankle.   Slowly bring your knee back so that your thigh is in line with your trunk. Keep your heel at your buttocks and gently arch your back so your head, shoulders and hips line up.   Slowly lower your leg so that your knee approaches the floor/bed until you feel a gentle stretch on the outside of your thigh. If you do not feel a stretch and your knee will not fall farther, place the heel of your opposite foot on top of your knee and pull your thigh down farther.   Hold this stretch for 30 seconds.   Repeat 3 times. Complete this exercise 1-2 times per day.  STRETCH - Hamstrings, Supine   Lie on your back. Loop a belt or towel over the ball of your foot as shown.   Straighten your knee and slowly pull on the belt to raise your injured leg. Do not allow the knee to bend. Keep your opposite leg flat on the floor.   Raise the leg until you feel a gentle stretch behind your knee or thigh. Hold this position  for 30 seconds.   Repeat 3 times. Complete this stretch 1-2 times per day.  STRETCH - Quadriceps, Prone   Lie on your stomach on a firm surface, such as a bed or padded floor.   Bend your knee and grasp your ankle. If you are unable to reach, your ankle or pant leg, use a belt around your foot to lengthen your reach.   Gently pull your heel toward your buttocks. Your knee should not slide out to the side. You should feel a stretch in the front of your thigh and/or knee.   Hold this position for 30 seconds.   Repeat 3 times. Complete this stretch 1-2 times per day.  STRETCHING - Hip Flexors, Lunge Half kneel with your knee on  the floor and your opposite knee bent and directly over your ankle.  Keep good posture with your head over your shoulders. Tighten your buttocks to point your tailbone downward; this will prevent your back from arching too much.   You should feel a gentle stretch in the front of your thigh and/or hip. If you do not feel any resistance, slightly slide your opposite foot forward and then slowly lunge forward so your knee once again lines up over your ankle. Be sure your tailbone remains pointed downward.   Hold this stretch for 30 seconds.   Repeat 3 times. Complete this stretch 1-2 times per day.  STRETCH - Adductors, Lunge  While standing, spread your legs   Lean away from your injured leg by bending your opposite knee. You may rest your hands on your thigh for balance.   You should feel a stretch in your inner thigh. Hold for 30 seconds.   Repeat 3 times. Complete this exercise 1-2 times per day.  SEEK IMMEDIATE MEDICAL CARE IF YOU DEVELOP:  Severe pain, fever, increased redness.   Pain that radiates below the knee.  Document Released: 09/01/2004 Document Re-Released: 05/22/2009 [facility] Patient Information 2011 [place], [place].

## 2011-05-16 NOTE — Assessment & Plan Note (Addendum)
Recheck A1C today -- except same as prior.   Continue Metformin at current dose. Last creatinine WNL in March. FU in 6 months, obtain labs at that time.   Instructions - goal for patient which we discussed and she came up with would be below 7.

## 2011-05-18 ENCOUNTER — Encounter: Payer: Self-pay | Admitting: Family Medicine

## 2011-05-18 NOTE — Assessment & Plan Note (Signed)
Patient not excited about steroid injection. We'll do conservative therapy for now. Patient given handout with stretches. Recommended she do these twice a day. Also to continue weightbearing and physical activity.

## 2011-12-09 ENCOUNTER — Encounter: Payer: Self-pay | Admitting: Family Medicine

## 2011-12-09 ENCOUNTER — Ambulatory Visit (INDEPENDENT_AMBULATORY_CARE_PROVIDER_SITE_OTHER): Payer: BC Managed Care – PPO | Admitting: Family Medicine

## 2011-12-09 VITALS — BP 128/87 | HR 85 | Ht 66.0 in | Wt 171.5 lb

## 2011-12-09 DIAGNOSIS — Z23 Encounter for immunization: Secondary | ICD-10-CM

## 2011-12-09 DIAGNOSIS — E785 Hyperlipidemia, unspecified: Secondary | ICD-10-CM

## 2011-12-09 DIAGNOSIS — R809 Proteinuria, unspecified: Secondary | ICD-10-CM | POA: Insufficient documentation

## 2011-12-09 DIAGNOSIS — E119 Type 2 diabetes mellitus without complications: Secondary | ICD-10-CM

## 2011-12-09 HISTORY — DX: Proteinuria, unspecified: R80.9

## 2011-12-09 LAB — POCT GLYCOSYLATED HEMOGLOBIN (HGB A1C): Hemoglobin A1C: 7.3

## 2011-12-09 MED ORDER — METFORMIN HCL 1000 MG PO TABS: 1000.0000 mg | ORAL_TABLET | Freq: Two times a day (BID) | ORAL | Status: DC

## 2011-12-09 MED ORDER — ENALAPRIL MALEATE 5 MG PO TABS: 5.0000 mg | ORAL_TABLET | Freq: Every day | ORAL | Status: DC

## 2011-12-09 MED ORDER — GLIPIZIDE 5 MG PO TABS: 5.0000 mg | ORAL_TABLET | Freq: Every day | ORAL | Status: DC

## 2011-12-09 NOTE — Assessment & Plan Note (Signed)
Patient on enalapril for proteinuria, will check microalbumin

## 2011-12-09 NOTE — Assessment & Plan Note (Addendum)
Still above 7%, given age, will push to get to goal- will add glipizide today.  Wrote rx for glucometer, encouraged her to check at least several fasting glucoses weekly, discussed signs and symptoms of hypoglycemia.   Borderline BP.  Will return for fasting blood work and to check proteinuria.   Will follow-up with PCP in 3 months.    Will refer to diabetes education

## 2011-12-09 NOTE — Patient Instructions (Addendum)
New medicine to add: glipizide for blood sugar  Check blood sugars fasting before breakfast- several times weekly  We will check your protein level in your urine and your cholesterol  Think about daily aerobic exercise to help your diabetes and help keep moods regular  Follow-up in 3 months with Dr. Gwendolyn Grant

## 2011-12-09 NOTE — Assessment & Plan Note (Signed)
Histroy of hyperlipidemia, not on meds.  Last check 2010.  Will return for fasting bloodwork, discussed will aim for tight control especially with DM and her mom with early stroke.

## 2011-12-09 NOTE — Progress Notes (Signed)
  Subjective:    Patient ID: Marisa Davis, female    DOB: 1971-06-18, 41 y.o.   MRN: [id number]  HPI DIABETES Dx 1996 Taking and tolerating: yes- Metformin 1000 bid Fasting blood sugars:no checking  Hypoglycemic symptoms: no Visual problems: no Monitoring feet: yes Numbness/Tingling: no Last eye exam: Sept 2012 per patient Diabetic Labs:  Lab Results  Component Value Date   HGBA1C 7.4 05/16/2011   HGBA1C 7.4 11/04/2010   HGBA1C 7.8 03/08/2010   Lab Results  Component Value Date   MICROALBUR 1.05 03/09/2010   LDLCALC 122* 04/28/2009   CREATININE 0.65 11/04/2010   Last microalbumin: Lab Results  Component Value Date   MICROALBUR 1.05 03/09/2010     States anxiety/depression well controlled no meds.  Review of Systems Neg except per HPI    Objective:   Physical Exam  GEN: Alert & Oriented, No acute distress CV:  Regular Rate & Rhythm, no murmur Respiratory:  Normal work of breathing, CTAB Abd:  + BS, soft, no tenderness to palpation Ext: no pre-tibial edema Feet:  Monofilament normal.  Skin intact.  Proprioception normal.      Assessment & Plan:

## 2011-12-13 ENCOUNTER — Other Ambulatory Visit: Payer: BC Managed Care – PPO

## 2011-12-15 ENCOUNTER — Other Ambulatory Visit: Payer: BC Managed Care – PPO

## 2011-12-15 DIAGNOSIS — E119 Type 2 diabetes mellitus without complications: Secondary | ICD-10-CM

## 2011-12-15 LAB — COMPREHENSIVE METABOLIC PANEL
CO2: 25 mEq/L (ref 19–32)
Calcium: 9.6 mg/dL (ref 8.4–10.5)
Chloride: 107 mEq/L (ref 96–112)
Glucose, Bld: 117 mg/dL — ABNORMAL HIGH (ref 70–99)
Sodium: 137 mEq/L (ref 135–145)
Total Bilirubin: 0.8 mg/dL (ref 0.3–1.2)
Total Protein: 6.9 g/dL (ref 6.0–8.3)

## 2011-12-15 LAB — LIPID PANEL
HDL: 41 mg/dL (ref >39)
Triglycerides: 73 mg/dL (ref <150)

## 2011-12-15 NOTE — Progress Notes (Signed)
CMP,FLP AND URINE CREAT/RATIO DONE TODAY Marisa Davis

## 2011-12-16 LAB — MICROALBUMIN / CREATININE URINE RATIO
Creatinine, Urine: 156.5 mg/dL
Microalb, Ur: 1.07 mg/dL (ref 0.00–1.89)

## 2012-01-12 ENCOUNTER — Encounter: Payer: Self-pay | Admitting: *Deleted

## 2012-01-12 ENCOUNTER — Encounter: Payer: BC Managed Care – PPO | Attending: Family Medicine | Admitting: *Deleted

## 2012-01-12 VITALS — Ht 66.0 in | Wt 175.0 lb

## 2012-01-12 DIAGNOSIS — Z713 Dietary counseling and surveillance: Secondary | ICD-10-CM | POA: Insufficient documentation

## 2012-01-12 DIAGNOSIS — E119 Type 2 diabetes mellitus without complications: Secondary | ICD-10-CM

## 2012-01-12 DIAGNOSIS — E669 Obesity, unspecified: Secondary | ICD-10-CM | POA: Insufficient documentation

## 2012-01-12 NOTE — Patient Instructions (Addendum)
Goals:  Aim for carbohydrate intake to 45 grams carbohydrate/meal (3 Choices +/- 1)  Aim for carbohydrate intake to 0-15 grams carbohydrate/snack if hungry  Monitor glucose levels in AM and PM as able  Call your insurance company to see which strips they pay the best for

## 2012-01-12 NOTE — Progress Notes (Signed)
  Medical Nutrition Therapy:  Appt start time: 0800 end time:  0900.  Assessment:  Primary concerns today: patient here for additional diabetes education and assistance with weight loss. She states history of diabetes for 16 years and current A1c is 7.3%. 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 She has a meter but tests only when she feels bad, maybe once a month. Recently she started on Glipizide and has noticed she feels shaky so I have recommended she test her BG when that happens again to document her actual BG.    MEDICATIONS: see list, Diabetes meds include Metformin and Glipizide   DIETARY INTAKE:  Usual eating pattern includes 3 meals and 1 snack per day.  Everyday foods include food variety of all food groups.  Avoided foods include none stated.    24-hr recall:  B ( AM): cereal OR PNB sandwich, 2% milk OR diet coke  Snk ( AM): none (sleeping) L ( 5 PM): meat, starch, veg, occasionally bread, diet coke or water Snk ( 10 PM): crackers and cheese OR protein shake OR fresh fruit D (2 AM): left overs but smaller portion, water or diet coke Snk ( PM): none Beverages: water, diet coke  Usual physical activity: plans to walk in neighborhood with better weather  Estimated energy needs: 1400 calories 158 g carbohydrates 105 g protein 39 g fat  Progress Towards Goal(s):  In progress.   Nutritional Diagnosis:  NB-1.1 Food and nutrition-related knowledge deficit As related to diabetes.  As evidenced by A1c of 7.3%.    Intervention:  Nutrition counseling and diabetes education provided. Discussed rationale and benefit of testing BG at alternate times of day to collect info on pre and post meal BGs. Also suggest she check with insurance company to determine most cost effective strips. Good understanding of Carb Counting in grams, also taught concept of Carb Choices. Goals:  Aim for carbohydrate intake to 45 grams carbohydrate/meal (3 Choices  +/- 1)  Aim for carbohydrate intake to 0-15 grams carbohydrate/snack if hungry  Monitor glucose levels in AM and PM as able  Call your insurance company to see which strips they pay the best for  Handouts given during visit include: .Living Well with Diabetes Carb Counting and Beyond handout  Meal Plan Card  Monitoring/Evaluation:  Dietary intake, exercise, SMBG, and body weight prn. Patient to call to make appointment.

## 2012-06-04 ENCOUNTER — Other Ambulatory Visit (HOSPITAL_COMMUNITY)
Admission: RE | Admit: 2012-06-04 | Discharge: 2012-06-04 | Disposition: A | Discharge status: Home / Self Care | Payer: BC Managed Care – PPO | Source: Ambulatory Visit | Attending: Family Medicine | Admitting: Family Medicine

## 2012-06-04 ENCOUNTER — Encounter: Payer: Self-pay | Admitting: Family Medicine

## 2012-06-04 ENCOUNTER — Ambulatory Visit (INDEPENDENT_AMBULATORY_CARE_PROVIDER_SITE_OTHER): Payer: BC Managed Care – PPO | Admitting: Family Medicine

## 2012-06-04 VITALS — BP 128/73 | HR 88 | Temp 98.4°F | Ht 65.5 in | Wt 182.2 lb

## 2012-06-04 DIAGNOSIS — N898 Other specified noninflammatory disorders of vagina: Secondary | ICD-10-CM | POA: Insufficient documentation

## 2012-06-04 DIAGNOSIS — N760 Acute vaginitis: Secondary | ICD-10-CM

## 2012-06-04 DIAGNOSIS — Z23 Encounter for immunization: Secondary | ICD-10-CM

## 2012-06-04 DIAGNOSIS — R809 Proteinuria, unspecified: Secondary | ICD-10-CM

## 2012-06-04 DIAGNOSIS — E119 Type 2 diabetes mellitus without complications: Secondary | ICD-10-CM

## 2012-06-04 DIAGNOSIS — E785 Hyperlipidemia, unspecified: Secondary | ICD-10-CM

## 2012-06-04 DIAGNOSIS — Z01419 Encounter for gynecological examination (general) (routine) without abnormal findings: Secondary | ICD-10-CM | POA: Insufficient documentation

## 2012-06-04 DIAGNOSIS — N912 Amenorrhea, unspecified: Secondary | ICD-10-CM

## 2012-06-04 DIAGNOSIS — Z1151 Encounter for screening for human papillomavirus (HPV): Secondary | ICD-10-CM | POA: Insufficient documentation

## 2012-06-04 DIAGNOSIS — Z124 Encounter for screening for malignant neoplasm of cervix: Secondary | ICD-10-CM

## 2012-06-04 DIAGNOSIS — N9489 Other specified conditions associated with female genital organs and menstrual cycle: Secondary | ICD-10-CM

## 2012-06-04 LAB — POCT WET PREP (WET MOUNT): Clue Cells Wet Prep Whiff POC: NEGATIVE

## 2012-06-04 LAB — POCT GLYCOSYLATED HEMOGLOBIN (HGB A1C): Hemoglobin A1C: 9.8

## 2012-06-04 NOTE — Patient Instructions (Addendum)
Your hemoglobin A1C today was 9.8.  This is significantly higher than last time, which was 7.3 back in May.    Continue working hard on food choices as described by the nutritionist.    I have sent in the cream as well.    Please make an appointment with Dr. Hazle Quant to have your eyes checked.

## 2012-06-04 NOTE — Progress Notes (Signed)
Patient ID: Marisa Davis, female   DOB: 03/16/1971, 41 y.o.   MRN: [id number] Marisa Davis is a 41 y.o. female who presents to [facility] today for yearly physical exam and Pap smear.  She also has concern for LMP in August:  1.  Last menses in August:  Patient previously regularly, every month.  Had bleeding for about 5 days.  Has never missed a period before.  Last period in August per usual.  Has had some dyspareunia due to increased vaginal dryness as well as hot flashes since then.  Also with pretty constant itching for past month.  No dysuria.  No fever or chills.   Diabetes:  Currently on Metformin and Glipizide, but admits to poor adherence (going multiple days in a row without taking her medications).    No adverse effects from medication.  No hypoglycemic events.  No paresthesia or peripheral nerve pain.  Measures blood sugars at home every:  Does not measure at home.  States that she knew A1C would be worse today.     Lab Results  Component Value Date   HGBA1C 9.8 06/04/2012    The following portions of the patient's history were reviewed and updated as appropriate: allergies, current medications, past medical history, family and social history, and problem list.  Patient is a nonsmoker.  Past Medical History  Diagnosis Date  . Anxiety   . Diabetes mellitus   . Microalbuminuria     History of, currently resolved    ROS as above otherwise neg. No Chest pain, palpitations, SOB, Fever, Chills, Abd pain, N/V/D.   Medications reviewed. Current Outpatient Prescriptions  Medication Sig Dispense Refill  . aspirin EC 81 MG EC tablet Take 81 mg by mouth every morning.        . enalapril (VASOTEC) 5 MG tablet Take 1 tablet (5 mg total) by mouth daily.  30 tablet  6  . glipiZIDE (GLUCOTROL) 5 MG tablet Take 1 tablet (5 mg total) by mouth daily.  30 tablet  6  . glucose monitoring kit (FREESTYLE) monitoring kit Glucometer,test strips,lancets - test blood sugar 1-2 times daily- disp QS of  each.       . metFORMIN (GLUCOPHAGE) 1000 MG tablet Take 1 tablet (1,000 mg total) by mouth 2 (two) times daily with a meal.  30 tablet  6    Exam:  BP 128/73  Pulse 88  Temp 98.4 F (36.9 C) (Oral)  Ht 5' 5.5" (1.664 m)  Wt 182 lb 3.2 oz (82.645 kg)  BMI 29.86 kg/m2  LMP 03/09/2012 Gen: Well NAD HEENT:  [place]/AT.  EOMI, PERRL.  MMM, tonsils non-erythematous, non-edematous.  External ears WNL, Bilateral TM's normal with scarring noted bilaterally. Pulm:  Clear to auscultation bilaterally with good air movement.  No wheezes or rales noted.   Cardiac:  Regular rate and rhythm without murmur auscultated.  Good S1/S2. Abd:  Soft/nondistended/nontender.  Good bowel sounds throughout all four quadrants.  No masses noted.  Exts: Non edematous BL  LE, warm and well perfused.  Psych: Conversant and pleasant.  Not depressed, anxious, or manic appearing.  Linear and non-tangential thought process noted Feet:  Diabetic foot exam revealed normal sensation both grossly and to monofilament.  +2 DP pulses BL  GYN:  External genitalia within normal limits.  Vaginal mucosa pink, moist, normal rugae.  Nonfriable cervix without lesions, no discharge or bleeding noted on speculum exam.   Pap and wet prep obtained today.     No results  found for this or any previous visit (from the past 72 hour(s)).

## 2012-06-05 ENCOUNTER — Telehealth: Payer: Self-pay | Admitting: Family Medicine

## 2012-06-05 NOTE — Telephone Encounter (Signed)
Had to leave message regarding labs.  Patient has yeast infection, likely secondary to worsening of diabetes.  I have sent in medicine for her.  This is likely contributing to her vaginal dryness.  Can you guys tell her this if she calls back or give her a call if she doesn't?  Thanks!

## 2012-06-05 NOTE — Assessment & Plan Note (Signed)
Not at goal. Adherence an issue. Would benefit from health coaching -- she knows the things she needs to do but doesn't always follow through. Discussed ways of dealing with upcoming holidays and being more mindful of food choices.  Foot examination today.  Also recommended to follow up with her ophthalmologist. FU in 3 months.

## 2012-06-05 NOTE — Assessment & Plan Note (Signed)
On ACE, although adherence an issue.  BP at goal today.

## 2012-06-05 NOTE — Telephone Encounter (Signed)
LVMOM informing of below

## 2012-06-05 NOTE — Assessment & Plan Note (Signed)
Not at goal.  Will discuss statin usage at next visit.

## 2012-06-05 NOTE — Assessment & Plan Note (Signed)
Possibly early onset menopause. Wet prep obtained today as well as Pap smear.

## 2012-06-08 ENCOUNTER — Telehealth: Payer: Self-pay | Admitting: Family Medicine

## 2012-06-08 MED ORDER — FLUCONAZOLE 150 MG PO TABS: 150.0000 mg | ORAL_TABLET | Freq: Once | ORAL | Status: AC

## 2012-06-08 NOTE — Telephone Encounter (Signed)
Patient notified

## 2012-06-08 NOTE — Telephone Encounter (Signed)
Please call her  Has positive wet prep for yeast I will send in diflucan  She should use this first If not better in one week call for Dr Simeon Craft [facility]

## 2012-06-08 NOTE — Telephone Encounter (Signed)
Will forward message to Dr. Deirdre Priest. Cannot find that medication was sent in. See  office note from 10/29.

## 2012-06-08 NOTE — Telephone Encounter (Signed)
Patient is calling because the medication that was supposed to be sent to her pharmacy to treat her yeast infection, is not there.  There was one for Estrogen and Yeast Infection, [facility] on [address].

## 2012-06-12 ENCOUNTER — Encounter: Payer: Self-pay | Admitting: Family Medicine

## 2012-06-15 NOTE — Progress Notes (Signed)
Scheduled health coaching appointment for 11/20 at 8:30.  PCP in clinic for co-visit

## 2012-06-22 NOTE — Progress Notes (Signed)
Scheduled health coaching appointment for 11/20.

## 2012-06-27 ENCOUNTER — Ambulatory Visit (INDEPENDENT_AMBULATORY_CARE_PROVIDER_SITE_OTHER): Payer: BC Managed Care – PPO | Admitting: Home Health Services

## 2012-06-27 ENCOUNTER — Encounter: Payer: Self-pay | Admitting: Home Health Services

## 2012-06-27 VITALS — BP 128/87 | HR 76 | Temp 97.6°F | Ht 66.0 in | Wt 179.0 lb

## 2012-06-27 DIAGNOSIS — E119 Type 2 diabetes mellitus without complications: Secondary | ICD-10-CM

## 2012-06-27 NOTE — Progress Notes (Signed)
  Subjective:    Patient ID: Marisa Davis, female    DOB: 1970/12/03, 41 y.o.   MRN: [id number]  HPI  DIABETES Disease Monitoring: Blood Sugar ranges-no monitoring Polyuria/phagia/dipsia- no      Visual problems- no  Medications: Compliance- Pt reports not taking glipizide, takes only 1000 mg of metformin.  Pt reports GI distress when taking 2000 mg of metformin Hypoglycemic symptoms- no  Patient Identified Concern:  Dm managment Stage of Change Patient Is In:  Preparation been making changes less than 6 months.  Patient Reported Barriers:  No motivation, current poor dietary habits, lack of exercise Patient Reported Perceived Benefits:  Feeling better, having dm under control Patient Reports Self-Efficacy:   Pt displays some self efficacy based on past successful experiences with dm management. Patient used statements, "I will get my A1C down but next visit." frequently.  Behavior Change Supports:  Husband is supportive Goals:  To increase metformin to 1500 mg a day. To start monitoring daily carbs (no more than 45g for meals, 15 g for snacks.)  Pt will allow her self Thanksgiving day off from monitoring and start back following day.  Patient Education:  We talked about ways to manage dm including: diet, exercise, med adherence, no smoking, stress reduction.  Pt chose to start working on med adherence and dietary changes.  Pt already knows a lot of lowering sugar/carbs in diet and expressed confidence in reading labels for carb content.  We talked about measuring carbs if not able to read labels by using the standard that 1 carb= 1 slice of bread or 1/2c scoopable.   Pt agreed to bi-weekly health coaching follow up phone calls.      Review of Systems     Objective:   Physical Exam  No acute distress.      Assessment & Plan:

## 2012-06-27 NOTE — Patient Instructions (Addendum)
1. Continue b-weekly phone calls with health coach, Arlys John [phone] 2. Start monitoring daily carbohydrate intake (no more than 45 g for meals, 15 g for snacks) 3. Start increasing daily metformin to 1500 mg.  4. Consider starting to monitor for daily blood sugar with your glucometer.

## 2012-06-27 NOTE — Assessment & Plan Note (Signed)
Continue counseling with health coach. Increase Metformin to 1500 mg daily Dietary monitoring of carbs

## 2012-07-04 ENCOUNTER — Telehealth: Payer: Self-pay | Admitting: Home Health Services

## 2012-07-04 NOTE — Telephone Encounter (Signed)
Spoke with Marisa Davis.  Pt reports taking medications daily without missing any days.  Pt reports not having increased metformin to 1500 mg at this point.  Plans on increasing it next Monday.  Pt reports doing a fairly good job monitoring carbs and only over did it once/twice in past week.  Pt set goal to increase metformin and to continue to count daily carbs for dm management.  Pt's overall goal is weight loss, med adherence, and dm management.

## 2012-09-02 ENCOUNTER — Other Ambulatory Visit: Payer: Self-pay | Admitting: Family Medicine

## 2012-10-11 ENCOUNTER — Other Ambulatory Visit: Payer: Self-pay | Admitting: Family Medicine

## 2012-12-03 ENCOUNTER — Ambulatory Visit: Payer: BC Managed Care – PPO | Admitting: Home Health Services

## 2012-12-03 ENCOUNTER — Ambulatory Visit (INDEPENDENT_AMBULATORY_CARE_PROVIDER_SITE_OTHER): Payer: BC Managed Care – PPO | Admitting: Family Medicine

## 2012-12-03 ENCOUNTER — Encounter: Payer: Self-pay | Admitting: Family Medicine

## 2012-12-03 VITALS — BP 115/79 | HR 85 | Ht 66.0 in | Wt 180.8 lb

## 2012-12-03 DIAGNOSIS — E119 Type 2 diabetes mellitus without complications: Secondary | ICD-10-CM

## 2012-12-03 DIAGNOSIS — L819 Disorder of pigmentation, unspecified: Secondary | ICD-10-CM | POA: Insufficient documentation

## 2012-12-03 LAB — CBC
HCT: 38.3 % (ref 36.0–46.0)
Hemoglobin: 12.4 g/dL (ref 12.0–15.0)
MCH: 26.2 pg (ref 26.0–34.0)
MCV: 81 fL (ref 78.0–100.0)
Platelets: 263 10*3/uL (ref 150–400)
RBC: 4.73 MIL/uL (ref 3.87–5.11)
WBC: 8.9 10*3/uL (ref 4.0–10.5)

## 2012-12-03 LAB — COMPREHENSIVE METABOLIC PANEL
ALT: 12 U/L (ref 0–35)
AST: 11 U/L (ref 0–37)
Alkaline Phosphatase: 89 U/L (ref 39–117)
BUN: 14 mg/dL (ref 6–23)
Chloride: 106 mEq/L (ref 96–112)
Creat: 0.59 mg/dL (ref 0.50–1.10)
Total Bilirubin: 0.5 mg/dL (ref 0.3–1.2)

## 2012-12-03 LAB — POCT GLYCOSYLATED HEMOGLOBIN (HGB A1C): Hemoglobin A1C: 9

## 2012-12-03 MED ORDER — METFORMIN HCL ER 750 MG PO TB24: 750.0000 mg | ORAL_TABLET | Freq: Every day | ORAL | Status: DC

## 2012-12-03 NOTE — Addendum Note (Signed)
Addended byGwendolyn Grant, JEFFREY H on: 12/03/2012 11:45 AM   Modules accepted: Level of Service

## 2012-12-03 NOTE — Assessment & Plan Note (Addendum)
Some improvement in A1C. She is difficult to monitor/maintain as she readily admits to not wanting to take any medicines ("not seeing the point" of chronic disease management).   She met with Arlys John today and they seem to have a good relationship.  Recommended referral to diabetic clinic (unclear if Glipizide "nervousness" side effect was actually hypoglycemia) for further recommendations, but patient declined this today. Switching to Metformin XR as she forgets or just doesn't take BID dosing of Metformin.  Hopefully if A1C stays about the same next visit, she will be more open to Pharm referral.  Total face time was >25 minutes discussing these issues.

## 2012-12-03 NOTE — Patient Instructions (Addendum)
Referral to Dermatology for your eyes.  Stop the other Metformin.  Start taking the once a day medicine.

## 2012-12-03 NOTE — Assessment & Plan Note (Signed)
Unclear etiology. No evidence of seasonal allergies. Checking TSH.   Patient desires referral to derm, will place for further evaluation and recommendations.

## 2012-12-03 NOTE — Progress Notes (Signed)
Patient Identified Concern:  Dm managment Stage of Change Patient Is In:  Contemplation, willing to make changes in next 6 months Patient Reported Barriers:  Not being mindful about diet Patient Reported Perceived Benefits:  Better control of dm Patient Reports Self-Efficacy:   Pt reports 5 out of 10 for behavior changes regarding dm management Behavior Change Supports:  Husband helps a little, "He has bad eating habits, that I follow." Goals:  To eat less sweets/carbs, more vegetables/lean protein.  Take medications as prescribed. Patient Education:  We talked about diet/exercise in relation to dm management.    Lab Results  Component Value Date   HGBA1C 9.0 12/03/2012   HGBA1C 9.8 06/04/2012   HGBA1C 7.3 12/09/2011     Assessment: Diabetes control: poor control (HgbA1C >9%) Progress toward A1C goal:  improved Comments: Pt reduced amount of medication prescribed daily.  Plan: Medications:  Start taking long acting metformin Home glucose monitoring: Frequency: once a day Timing:   Instruction/counseling given: reminded to bring blood glucose meter & log to each visit, reminded to bring medications to each visit and discussed diet Educational resources provided:   Self management tools provided:   Other plans:  To eat less sweets/carbs, more vegetables/lean protein.  Take medications as prescribed.

## 2012-12-03 NOTE — Progress Notes (Signed)
Subjective:    Marisa Davis is a 42 y.o. female who presents to [facility] today for diabetes:  1.   Diabetes:  Currently on only Metformin and Glipizide, though no longer taking Glipizide and only on Metformin once a day.  No adverse effects from medication.  No hypoglycemic events.  No paresthesia or peripheral nerve pain.  Measures blood sugars at home every: does not check at home.  She admits to not taking her medications much due to "not feeling bad" and not understanding why she needs medicine if she doesn't feel poorly.   Lab Results  Component Value Date   HGBA1C 9.0 12/03/2012   2.  "Circles under eyes:"  Present for several months. Has not noted any other skin lesions.  She thought it was secondary to her make-up, but first she switched make-up and then has stopped using any for past several months.  Does not itch or burn, no pain.  Has had some runny eyes during winter but none now.  No history or current symptoms of seasonal allergies.    The following portions of the patient's history were reviewed and updated as appropriate: allergies, current medications, past medical history, family and social history, and problem list. Patient is a nonsmoker.    PMH reviewed.  Past Medical History  Diagnosis Date  . Anxiety   . Diabetes mellitus   . Microalbuminuria     History of, currently resolved   No past surgical history on file.  Medications reviewed. Current Outpatient Prescriptions  Medication Sig Dispense Refill  . aspirin EC 81 MG EC tablet Take 81 mg by mouth every morning.        . enalapril (VASOTEC) 5 MG tablet TAKE ONE TABLET BY MOUTH ONE TIME DAILY  30 tablet  3  . glipiZIDE (GLUCOTROL) 5 MG tablet Take 1 tablet (5 mg total) by mouth daily.  30 tablet  6  . glucose monitoring kit (FREESTYLE) monitoring kit Glucometer,test strips,lancets - test blood sugar 1-2 times daily- disp QS of each.       . metFORMIN (GLUCOPHAGE) 1000 MG tablet TAKE ONE TABLET BY MOUTH TWICE A DAY  WITH MEALS  30 tablet  3   No current facility-administered medications for this visit.    ROS as above otherwise neg.  No chest pain, palpitations, SOB, Fever, Chills, Abd pain, N/V/D.   Objective:   Physical Exam BP 115/79  Pulse 85  Ht 5\' 6"  (1.676 m)  Wt 180 lb 12.8 oz (82.01 kg)  BMI 29.2 kg/m2  LMP 11/21/2012 Gen:  Alert, cooperative patient who appears stated age in no acute distress.  Vital signs reviewed. HEENT: EOMI.  No scleral/conjunctival injection noted, no eye drainage.  MMM Neck:  No thyromegaly noted.  Cardiac:  Regular rate and rhythm without murmur auscultated.  Good S1/S2. Pulm:  Clear to auscultation bilaterally with good air movement.  No wheezes or rales noted.   Exts: Non edematous BL  LE, warm and well perfused.  Skin:  Hyperpigmentated areas BL noted in almost a malar rash distribution on anterior portion of each of her cheeks but sparing bridge of her nose.  No swelling noted here.  No tenderness of tear nor lacrimal ducts.  No other skin lesions noted throughout, no evidence of acanthosis nigricans.   No results found for this or any previous visit (from the past 72 hour(s)).

## 2012-12-04 ENCOUNTER — Encounter: Payer: Self-pay | Admitting: Family Medicine

## 2013-02-14 ENCOUNTER — Other Ambulatory Visit: Payer: Self-pay

## 2013-07-01 ENCOUNTER — Other Ambulatory Visit: Payer: Self-pay | Admitting: Family Medicine

## 2013-09-09 ENCOUNTER — Other Ambulatory Visit: Payer: Self-pay | Admitting: Family Medicine

## 2013-11-12 ENCOUNTER — Encounter: Payer: Self-pay | Admitting: Family Medicine

## 2013-11-12 ENCOUNTER — Ambulatory Visit (INDEPENDENT_AMBULATORY_CARE_PROVIDER_SITE_OTHER): Payer: BC Managed Care – PPO | Admitting: Family Medicine

## 2013-11-12 VITALS — BP 135/82 | HR 76 | Temp 98.7°F | Ht 66.0 in | Wt 181.0 lb

## 2013-11-12 DIAGNOSIS — Z23 Encounter for immunization: Secondary | ICD-10-CM

## 2013-11-12 DIAGNOSIS — E119 Type 2 diabetes mellitus without complications: Secondary | ICD-10-CM

## 2013-11-12 LAB — COMPREHENSIVE METABOLIC PANEL
ALK PHOS: 88 U/L (ref 39–117)
ALT: 13 U/L (ref 0–35)
AST: 13 U/L (ref 0–37)
Albumin: 4.1 g/dL (ref 3.5–5.2)
BILIRUBIN TOTAL: 0.5 mg/dL (ref 0.2–1.2)
BUN: 17 mg/dL (ref 6–23)
CO2: 26 mEq/L (ref 19–32)
Calcium: 9.5 mg/dL (ref 8.4–10.5)
Chloride: 102 mEq/L (ref 96–112)
Creat: 0.53 mg/dL (ref 0.50–1.10)
GLUCOSE: 169 mg/dL — AB (ref 70–99)
Potassium: 3.9 mEq/L (ref 3.5–5.3)
SODIUM: 139 meq/L (ref 135–145)
TOTAL PROTEIN: 7.1 g/dL (ref 6.0–8.3)

## 2013-11-12 LAB — CBC
HCT: 34.4 % — ABNORMAL LOW (ref 36.0–46.0)
Hemoglobin: 11.7 g/dL — ABNORMAL LOW (ref 12.0–15.0)
MCH: 25.8 pg — AB (ref 26.0–34.0)
MCHC: 34 g/dL (ref 30.0–36.0)
MCV: 75.8 fL — AB (ref 78.0–100.0)
PLATELETS: 262 10*3/uL (ref 150–400)
RBC: 4.54 MIL/uL (ref 3.87–5.11)
RDW: 17.1 % — ABNORMAL HIGH (ref 11.5–15.5)
WBC: 7.7 10*3/uL (ref 4.0–10.5)

## 2013-11-12 LAB — POCT GLYCOSYLATED HEMOGLOBIN (HGB A1C): Hemoglobin A1C: 8.6

## 2013-11-12 LAB — LDL CHOLESTEROL, DIRECT: LDL DIRECT: 163 mg/dL — AB

## 2013-11-12 MED ORDER — METFORMIN HCL 1000 MG PO TABS: ORAL_TABLET | ORAL | Status: DC

## 2013-11-12 NOTE — Progress Notes (Signed)
Subjective:    Marisa Davis is a 43 y.o. female who presents to [facility] today for chronic issue FU:  1. Diabetes:  Currently on Metformin and Glipizide.   No adverse effects from medication.  No hypoglycemic events.  No paresthesia or peripheral nerve pain.  Measures blood sugars at home every:  Doesn't measure at home.  Concern for not taking her meds as prescribed. Last refill for Glipizide was 2 years ago, Metformin was 1 month ago. Lab Results  Component Value Date   HGBA1C 9.0 12/03/2012    States she doesn't like coming to the doctor.  Makes her nervous.  This is reason for not coming often.    Prev health:  Currently overdue for chronic diabetes checks.  ROS as above per HPI, otherwise neg.  Pertinently, no chest pain, palpitations, SOB, Fever, Chills, Abd pain, N/V/D.   The following portions of the patient's history were reviewed and updated as appropriate: allergies, current medications, past medical history, family and social history, and problem list. Patient is a nonsmoker.    PMH reviewed.  Past Medical History  Diagnosis Date  . Anxiety   . Diabetes mellitus   . Microalbuminuria     History of, currently resolved   No past surgical history on file.  Medications reviewed. Current Outpatient Prescriptions  Medication Sig Dispense Refill  . aspirin EC 81 MG EC tablet Take 81 mg by mouth every morning.        . enalapril (VASOTEC) 5 MG tablet TAKE 1 TABLET BY MOUTH EVERY DAY  90 tablet  0  . glipiZIDE (GLUCOTROL) 5 MG tablet Take 1 tablet (5 mg total) by mouth daily.  30 tablet  6  . glucose monitoring kit (FREESTYLE) monitoring kit Glucometer,test strips,lancets - test blood sugar 1-2 times daily- disp QS of each.       . metFORMIN (GLUCOPHAGE) 1000 MG tablet TAKE 1 TABLET BY MOUTH TWICE DAILY WITH MEALS  180 tablet  0  . metFORMIN (GLUCOPHAGE-XR) 750 MG 24 hr tablet Take 1 tablet (750 mg total) by mouth daily with breakfast.  30 tablet  3   No current  facility-administered medications for this visit.     Objective:   Physical Exam There were no vitals taken for this visit. Gen:  Alert, cooperative patient who appears stated age in no acute distress.  Vital signs reviewed. HEENT: EOMI,  MMM Neck:  No thryomegaly. Cardiac:  Regular rate and rhythm  Pulm:  Clear to auscultation bilaterally  Abd:  Soft/nondistended/nontender.  Good bowel sounds throughout all four quadrants.  No masses noted.  Exts: BL feet with intact sensation to monofilament all areas of testing.   Psych:  Pleasant and conversant.  Not depressed appearing.   No results found for this or any previous visit (from the past 72 hour(s)).

## 2013-11-12 NOTE — Assessment & Plan Note (Signed)
REfilled metformin.  Had problems with glipizide (hypoglycemia). Discussed that without other medications, will not reach A1C goal of near 7. Negotiated with patient -- she would like to do 6 months instead of 3 without waiting until 12 months.   Discussed increasing activity and eating healthy foods.   Foot exam, Prevnar, retinopathy scan, and labwork today.

## 2013-11-12 NOTE — Patient Instructions (Addendum)
You have Athlete's foot.  Pick up any of the over the counter prescriptions for this.  My recommendation Clotrimazole.    Come back to see me in 6 months.  Refill for Metformin.  Continue working on being active and eatingmore fruits and vegetables to help with diabetes.   We will check your eyes and bloodwork today.   Pneumonia shot today.  It was good to see you!

## 2013-11-18 ENCOUNTER — Telehealth: Payer: Self-pay | Admitting: Family Medicine

## 2013-11-18 NOTE — Telephone Encounter (Signed)
Called to discuss elevated LDL and starting statin.  Had to leave message.  Asked patient to return call.

## 2013-12-20 ENCOUNTER — Other Ambulatory Visit: Payer: Self-pay | Admitting: Family Medicine

## 2014-01-24 ENCOUNTER — Other Ambulatory Visit: Payer: Self-pay | Admitting: Family Medicine

## 2014-10-07 ENCOUNTER — Ambulatory Visit (INDEPENDENT_AMBULATORY_CARE_PROVIDER_SITE_OTHER): Payer: BLUE CROSS/BLUE SHIELD | Admitting: Family Medicine

## 2014-10-07 ENCOUNTER — Encounter: Payer: Self-pay | Admitting: Family Medicine

## 2014-10-07 VITALS — BP 124/85 | HR 76 | Temp 98.2°F | Ht 66.0 in | Wt 172.8 lb

## 2014-10-07 DIAGNOSIS — Z0000 Encounter for general adult medical examination without abnormal findings: Secondary | ICD-10-CM

## 2014-10-07 DIAGNOSIS — E785 Hyperlipidemia, unspecified: Secondary | ICD-10-CM | POA: Diagnosis not present

## 2014-10-07 DIAGNOSIS — E1310 Other specified diabetes mellitus with ketoacidosis without coma: Secondary | ICD-10-CM

## 2014-10-07 DIAGNOSIS — E1110 Type 2 diabetes mellitus with ketoacidosis without coma: Secondary | ICD-10-CM

## 2014-10-07 DIAGNOSIS — B353 Tinea pedis: Secondary | ICD-10-CM

## 2014-10-07 DIAGNOSIS — E119 Type 2 diabetes mellitus without complications: Secondary | ICD-10-CM

## 2014-10-07 LAB — CBC
HCT: 37.5 % (ref 36.0–46.0)
Hemoglobin: 12.2 g/dL (ref 12.0–15.0)
MCH: 28 pg (ref 26.0–34.0)
MCHC: 32.5 g/dL (ref 30.0–36.0)
MCV: 86.2 fL (ref 78.0–100.0)
MPV: 9.5 fL (ref 8.6–12.4)
Platelets: 243 10*3/uL (ref 150–400)
RBC: 4.35 MIL/uL (ref 3.87–5.11)
RDW: 13.9 % (ref 11.5–15.5)
WBC: 6.9 10*3/uL (ref 4.0–10.5)

## 2014-10-07 LAB — BASIC METABOLIC PANEL
BUN: 10 mg/dL (ref 6–23)
CHLORIDE: 105 meq/L (ref 96–112)
CO2: 24 meq/L (ref 19–32)
Calcium: 9.3 mg/dL (ref 8.4–10.5)
Creat: 0.58 mg/dL (ref 0.50–1.10)
Glucose, Bld: 129 mg/dL — ABNORMAL HIGH (ref 70–99)
POTASSIUM: 3.9 meq/L (ref 3.5–5.3)
Sodium: 139 mEq/L (ref 135–145)

## 2014-10-07 LAB — LIPID PANEL
CHOL/HDL RATIO: 3.6 ratio
Cholesterol: 171 mg/dL (ref 0–200)
HDL: 47 mg/dL (ref >=46)
LDL CALC: 106 mg/dL — AB (ref 0–99)
Triglycerides: 89 mg/dL (ref <150)
VLDL: 18 mg/dL (ref 0–40)

## 2014-10-07 LAB — TSH: TSH: 1.295 u[IU]/mL (ref 0.350–4.500)

## 2014-10-07 LAB — POCT GLYCOSYLATED HEMOGLOBIN (HGB A1C): Hemoglobin A1C: 7.2

## 2014-10-07 MED ORDER — METFORMIN HCL 1000 MG PO TABS: ORAL_TABLET | ORAL | Status: DC

## 2014-10-07 MED ORDER — ENALAPRIL MALEATE 5 MG PO TABS: 5.0000 mg | ORAL_TABLET | Freq: Every day | ORAL | Status: DC

## 2014-10-07 NOTE — Assessment & Plan Note (Signed)
Repeat lipid panel today. 

## 2014-10-07 NOTE — Assessment & Plan Note (Signed)
Treat with Lotrimin as this worked before.  FU if no improvement.

## 2014-10-07 NOTE — Progress Notes (Signed)
Subjective:    Marisa Davis is a 44 y.o. female who presents to [facility] today:  1.  Diabetes:  Currently on metformin.  No adverse effects from medication.  No hypoglycemic events.  No paresthesia or peripheral nerve pain.  She is exercising and eating more fruits and vegetables and less processed foods for the past several months.      Lab Results  Component Value Date   HGBA1C 7.2 10/07/2014   2.  Toes itching: Diagnosed with tinea pedis in past.  Cleared with Lotrimin.  Hasn't used this for months.  Itching returned about 1-2 months ago.  Husband doesn't have symptoms.   ROS as above per HPI, otherwise neg.  Pertinently, no chest pain, palpitations, SOB, Fever, Chills, Abd pain, N/V/D.   The following portions of the patient's history were reviewed and updated as appropriate: allergies, current medications, past medical history, family and social history, and problem list. Patient is a nonsmoker.    PMH reviewed.  Past Medical History  Diagnosis Date  . Anxiety   . Diabetes mellitus   . Microalbuminuria     History of, currently resolved   No past surgical history on file.  Medications reviewed. Current Outpatient Prescriptions  Medication Sig Dispense Refill  . aspirin EC 81 MG EC tablet Take 81 mg by mouth every morning.      . enalapril (VASOTEC) 5 MG tablet Take 1 tablet (5 mg total) by mouth daily. 90 tablet 2  . glucose monitoring kit (FREESTYLE) monitoring kit Glucometer,test strips,lancets - test blood sugar 1-2 times daily- disp QS of each.     . metFORMIN (GLUCOPHAGE) 1000 MG tablet TAKE 1 TABLET BY MOUTH TWICE DAILY WITH MEALS 180 tablet 2   No current facility-administered medications for this visit.     Objective:   Physical Exam BP 124/85 mmHg  Pulse 76  Temp(Src) 98.2 F (36.8 C) (Oral)  Ht 5' 6"  (1.676 m)  Wt 172 lb 12.8 oz (78.382 kg)  BMI 27.90 kg/m2  LMP 09/29/2014 Gen:  Alert, cooperative patient who appears stated age in no acute distress.  Vital  signs reviewed. HEENT: EOMI,  MMM Neck:  No LAD, no thyromegaly.  Cardiac:  Regular rate and rhythm  Pulm:  Clear to auscultation bilaterally    Ext:  Some mild maceration between feet.  Also with evidence of tinea pedis.   Foot exam:  Sensation intact to proprioception and light touch.  Pulses +2.   Results for orders placed or performed in visit on 10/07/14 (from the past 72 hour(s))  HgB A1c     Status: None   Collection Time: 10/07/14  9:00 AM  Result Value Ref Range   Hemoglobin A1C 7.2

## 2014-10-07 NOTE — Assessment & Plan Note (Signed)
Doing much better. Foot exam today. Continue ACE-I. Checking lipids.  Refill for Metformin today.  Continue exercise and diet as she has been.

## 2014-10-07 NOTE — Patient Instructions (Addendum)
Everything looks great today!  Your A1C was 7.2 so make sure you keep doing what you're doing.  Put the Lotrimin on your toes.  This should help.  Refills today and blood work today.    Mammogram info today.    Your Pap smear isn't due until after October.

## 2014-10-07 NOTE — Assessment & Plan Note (Signed)
Mammogram info provided today.  Pap not due until October.

## 2014-10-08 ENCOUNTER — Encounter: Payer: Self-pay | Admitting: Family Medicine

## 2014-10-13 ENCOUNTER — Other Ambulatory Visit: Payer: Self-pay

## 2014-10-13 ENCOUNTER — Telehealth: Payer: Self-pay | Admitting: Family Medicine

## 2014-10-13 DIAGNOSIS — Z1231 Encounter for screening mammogram for malignant neoplasm of breast: Secondary | ICD-10-CM

## 2014-10-13 NOTE — Telephone Encounter (Signed)
Pt called and said that she attempted to make an appt with [facility] and they informed her that the provider needs to call and schedule the appt. Please call pt @ home once complete / thanks sr

## 2014-10-13 NOTE — Telephone Encounter (Signed)
After speaking with [facility] due to the pt telling them she was having issues they will not just schedule a screening. LVM for pt to return call to see exactly what kinds of issues she is having with her breast.   After speaking to her, will see what Dr. Gwendolyn GrantWalden would like to do.  If he wants to put in referral or have pt return to see about this issue. Lamonte SakaiZimmerman Rumple, Maikel Neisler D

## 2014-10-13 NOTE — Telephone Encounter (Signed)
It's just a screening mammogram, NOT diagnostic.  Would you call and let the breast center know so they can schedule her?  Thanks!  JW

## 2014-10-13 NOTE — Telephone Encounter (Signed)
Contacted the [facility] about her not being able to schedule and appointment herself.  The scheduler stated that when Ms. Marisa Davis called she stated that she was having axillary pain and some tenderness and the scheduler stated that she could not schedule a diagnostic mammogram.  I did not see any referrals entered so I wanted to see if it was supposed to be for just a screening or for a diagnostic.  In the notes it stated under preventative care that she was given the info for a mammogram. Please advise. Lamonte SakaiZimmerman Rumple, April D

## 2014-10-17 ENCOUNTER — Ambulatory Visit (INDEPENDENT_AMBULATORY_CARE_PROVIDER_SITE_OTHER): Payer: BLUE CROSS/BLUE SHIELD | Admitting: Family Medicine

## 2014-10-17 ENCOUNTER — Encounter: Payer: Self-pay | Admitting: Family Medicine

## 2014-10-17 VITALS — BP 107/69 | HR 85 | Temp 98.4°F | Ht 66.0 in | Wt 171.1 lb

## 2014-10-17 DIAGNOSIS — Z0000 Encounter for general adult medical examination without abnormal findings: Secondary | ICD-10-CM | POA: Diagnosis not present

## 2014-10-17 DIAGNOSIS — L731 Pseudofolliculitis barbae: Secondary | ICD-10-CM | POA: Insufficient documentation

## 2014-10-17 NOTE — Telephone Encounter (Signed)
Patient came into and seen by Dr. Gwendolyn GrantWalden, no diagnostic needed

## 2014-10-17 NOTE — Progress Notes (Addendum)
Subjective:    Marisa Davis is a 44 y.o. female [facility]  1.  Underarm lump:  Patient called breast center for appt but mentioned underarm lump and soreness.  Referred her to ensure she doesn't need diagnositc screening mammogram.  Small lump under Left armpit present for several months.  She shaves her armpits.  No drainage, no redness, no fevers.    The following portions of the patient's history were reviewed and updated as appropriate: allergies, current medications, past medical history, family and social history, and problem list. Patient is a nonsmoker.    PMH reviewed.  Past Medical History  Diagnosis Date  . Anxiety   . Diabetes mellitus   . Microalbuminuria     History of, currently resolved   No past surgical history on file.  Medications reviewed.    Objective:   Physical Exam BP 107/69 mmHg  Pulse 85  Temp(Src) 98.4 F (36.9 C) (Oral)  Ht 5\' 6"  (1.676 m)  Wt 171 lb 1.6 oz (77.61 kg)  BMI 27.63 kg/m2  LMP 09/29/2014 Gen:  Alert, cooperative patient who appears stated age in no acute distress.  Vital signs reviewed. Left axilla:  Small 1cm in diameter mobile and somewhat tender mass noted just beneath skin of Left underarm.  I can see ingrown hair.  No fluctuance or surrounding erythema.   Jimmy FootmanSara Evans staff CMA present as Biomedical engineerchaperone.   No results found for this or any previous visit (from the past 72 hour(s)).

## 2014-10-17 NOTE — Assessment & Plan Note (Signed)
No need for diagnostic MM Fine for screening.  To call breast center to schedule.

## 2014-10-17 NOTE — Assessment & Plan Note (Signed)
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  Somewhat tender, but only mildly.  After discussion with her she would like trial off shaving to see if this resolves.   If no resolution, will attempt abx vs surgical removal.  She is to call adn let me know if she desires these.

## 2014-11-20 LAB — HM DIABETES EYE EXAM

## 2015-01-02 ENCOUNTER — Encounter: Payer: Self-pay | Admitting: Family Medicine

## 2015-12-03 ENCOUNTER — Other Ambulatory Visit: Payer: Self-pay | Admitting: Family Medicine

## 2015-12-22 ENCOUNTER — Ambulatory Visit (INDEPENDENT_AMBULATORY_CARE_PROVIDER_SITE_OTHER): Payer: Managed Care, Other (non HMO) | Admitting: Family Medicine

## 2015-12-22 ENCOUNTER — Encounter: Payer: Self-pay | Admitting: Family Medicine

## 2015-12-22 VITALS — BP 116/72 | HR 84 | Temp 98.3°F | Ht 66.0 in | Wt 183.6 lb

## 2015-12-22 DIAGNOSIS — E119 Type 2 diabetes mellitus without complications: Secondary | ICD-10-CM | POA: Diagnosis not present

## 2015-12-22 DIAGNOSIS — R809 Proteinuria, unspecified: Secondary | ICD-10-CM | POA: Diagnosis not present

## 2015-12-22 DIAGNOSIS — Z0000 Encounter for general adult medical examination without abnormal findings: Secondary | ICD-10-CM

## 2015-12-22 DIAGNOSIS — F341 Dysthymic disorder: Secondary | ICD-10-CM

## 2015-12-22 DIAGNOSIS — E785 Hyperlipidemia, unspecified: Secondary | ICD-10-CM

## 2015-12-22 LAB — CBC
HCT: 42 % (ref 35.0–45.0)
HEMOGLOBIN: 13.3 g/dL (ref 11.7–15.5)
MCH: 25.6 pg — AB (ref 27.0–33.0)
MCHC: 31.7 g/dL — AB (ref 32.0–36.0)
MCV: 80.8 fL (ref 80.0–100.0)
MPV: 9.3 fL (ref 7.5–12.5)
PLATELETS: 231 10*3/uL (ref 140–400)
RBC: 5.2 MIL/uL — ABNORMAL HIGH (ref 3.80–5.10)
RDW: 17.1 % — ABNORMAL HIGH (ref 11.0–15.0)
WBC: 6.8 10*3/uL (ref 3.8–10.8)

## 2015-12-22 LAB — COMPREHENSIVE METABOLIC PANEL
ALT: 13 U/L (ref 6–29)
AST: 12 U/L (ref 10–35)
Albumin: 4 g/dL (ref 3.6–5.1)
Alkaline Phosphatase: 102 U/L (ref 33–115)
BILIRUBIN TOTAL: 0.5 mg/dL (ref 0.2–1.2)
BUN: 18 mg/dL (ref 7–25)
CHLORIDE: 102 mmol/L (ref 98–110)
CO2: 26 mmol/L (ref 20–31)
CREATININE: 0.64 mg/dL (ref 0.50–1.10)
Calcium: 9.5 mg/dL (ref 8.6–10.2)
GLUCOSE: 273 mg/dL — AB (ref 65–99)
Potassium: 4.4 mmol/L (ref 3.5–5.3)
Sodium: 138 mmol/L (ref 135–146)
Total Protein: 7.2 g/dL (ref 6.1–8.1)

## 2015-12-22 LAB — LIPID PANEL
Cholesterol: 217 mg/dL — ABNORMAL HIGH (ref 125–200)
HDL: 49 mg/dL (ref >=46)
LDL CALC: 153 mg/dL — AB (ref <130)
Total CHOL/HDL Ratio: 4.4 Ratio (ref <=5.0)
Triglycerides: 74 mg/dL (ref <150)
VLDL: 15 mg/dL (ref <30)

## 2015-12-22 LAB — POCT GLYCOSYLATED HEMOGLOBIN (HGB A1C): HEMOGLOBIN A1C: 11

## 2015-12-22 MED ORDER — GLIPIZIDE ER 2.5 MG PO TB24: 2.5000 mg | ORAL_TABLET | Freq: Every day | ORAL | Status: DC

## 2015-12-22 NOTE — Assessment & Plan Note (Signed)
She currently denies any anxiety or depression. However her affect is much her description of her mood. She always appears a little anxious. She has told me in the past does not like coming to the doctor and that is why she only comes once a year.

## 2015-12-22 NOTE — Assessment & Plan Note (Signed)
On enalapril.  Taking inconsistently.   Refills provided today.  Checking creatinine.

## 2015-12-22 NOTE — Assessment & Plan Note (Signed)
Declined all preventative measures across the board. Foot exam today.  HIV with blood test.

## 2015-12-22 NOTE — Patient Instructions (Signed)
Checking bloodwork today.   I have sent in the Glipizide for you.  Take the Metformin twice.    Come back and we can recheck things in 3 months.    It was good to see you again today!

## 2015-12-22 NOTE — Assessment & Plan Note (Signed)
Uncontrolled.  Secondary to stress at home (lost her job about 2 months ago) as well as nonadherence to medication regimen and inconsistent diet. Reinforced taking her metformin more regularly. We also restarting glipizide today.  She occasionally has had hypoglycemia with this so starting at lower dose and will increase up.  To take with food.  She has had A1c of greater than 10 back in 2009. She was able to bring this down to 8 and 7.2 at our last visit with lifestyle changes and medication adherence. Reemphasize this again today. Follow-up in 3 months. I provided her refills only for the next 3 months. Will have a discussion about insulin use at that visit if A1c stays elevated.

## 2015-12-22 NOTE — Progress Notes (Signed)
Subjective:    Marisa Davis is a 45 y.o. female who presents to [facility] today for diabetes:  1.  Diabetes:  Currently on     No adverse effects from medication.  No hypoglycemic events.  No paresthesia or peripheral nerve pain.  Measures blood sugars at home every:  Does not check at home.  Her meter has been out of batteries for several months.  Has also been only inconsistently taking her metformin and, when she does, taking it only once a day.    Lab Results  Component Value Date   HGBA1C 11.0 12/22/2015   ROS as above per HPI, otherwise neg.    The following portions of the patient's history were reviewed and updated as appropriate: allergies, current medications, past medical history, family and social history, and problem list. Patient is a nonsmoker.    PMH reviewed.  Past Medical History  Diagnosis Date  . Anxiety   . Diabetes mellitus   . Microalbuminuria     History of, currently resolved   No past surgical history on file.  Medications reviewed. Current Outpatient Prescriptions  Medication Sig Dispense Refill  . aspirin EC 81 MG EC tablet Take 81 mg by mouth every morning.      . enalapril (VASOTEC) 5 MG tablet TAKE 1 TABLET BY MOUTH DAILY 30 tablet 0  . glucose monitoring kit (FREESTYLE) monitoring kit Glucometer,test strips,lancets - test blood sugar 1-2 times daily- disp QS of each.     . metFORMIN (GLUCOPHAGE) 1000 MG tablet TAKE 1 TABLET BY MOUTH TWICE DAILY WITH MEALS 60 tablet 0   No current facility-administered medications for this visit.     Objective:   Physical Exam BP 116/72 mmHg  Pulse 84  Temp(Src) 98.3 F (36.8 C) (Oral)  Ht 5' 6"  (1.676 m)  Wt 183 lb 9.6 oz (83.28 kg)  BMI 29.65 kg/m2  LMP  Gen:  Alert, cooperative patient who appears stated age in no acute distress.  Vital signs reviewed. HEENT: EOMI, PERRL, no cataracts noted. MMM Cardiac:  Regular rate and rhythm without murmur auscultated.  Good S1/S2. Pulm:  Clear to auscultation  bilaterally with good air movement.  No wheezes or rales noted.   Exts: Non edematous BL  LE, warm and well perfused.  Foot exam: No deformities, ulcerations, or other skin breakdown BL feet.  Sensation intact to monofilament and light touch.  PT and DP pulses intact BL.   Psych:  Seems a little anxious, which is as per usual for her.  Denies any depressive/anxiety issues however.    Results for orders placed or performed in visit on 12/22/15 (from the past 72 hour(s))  POCT A1C     Status: Abnormal   Collection Time: 12/22/15 10:22 AM  Result Value Ref Range   Hemoglobin A1C 11.0

## 2015-12-22 NOTE — Assessment & Plan Note (Signed)
Checking lipids today.

## 2015-12-23 ENCOUNTER — Encounter: Payer: Self-pay | Admitting: Family Medicine

## 2015-12-23 LAB — HIV ANTIBODY (ROUTINE TESTING W REFLEX): HIV: NONREACTIVE

## 2016-01-03 ENCOUNTER — Other Ambulatory Visit: Payer: Self-pay | Admitting: Family Medicine

## 2016-01-16 ENCOUNTER — Other Ambulatory Visit: Payer: Self-pay | Admitting: Family Medicine

## 2016-02-18 ENCOUNTER — Other Ambulatory Visit: Payer: Self-pay | Admitting: Family Medicine

## 2016-03-09 ENCOUNTER — Encounter (HOSPITAL_COMMUNITY): Payer: Self-pay | Admitting: Emergency Medicine

## 2016-03-09 ENCOUNTER — Emergency Department (HOSPITAL_COMMUNITY)
Admission: EM | Admit: 2016-03-09 | Discharge: 2016-03-09 | Disposition: A | Discharge status: Home / Self Care | Payer: Managed Care, Other (non HMO) | Attending: Emergency Medicine | Admitting: Emergency Medicine

## 2016-03-09 ENCOUNTER — Emergency Department (HOSPITAL_COMMUNITY): Payer: Managed Care, Other (non HMO)

## 2016-03-09 DIAGNOSIS — R079 Chest pain, unspecified: Secondary | ICD-10-CM | POA: Diagnosis present

## 2016-03-09 DIAGNOSIS — R0789 Other chest pain: Secondary | ICD-10-CM | POA: Insufficient documentation

## 2016-03-09 DIAGNOSIS — E119 Type 2 diabetes mellitus without complications: Secondary | ICD-10-CM | POA: Diagnosis not present

## 2016-03-09 DIAGNOSIS — Z7982 Long term (current) use of aspirin: Secondary | ICD-10-CM | POA: Diagnosis not present

## 2016-03-09 DIAGNOSIS — Z7984 Long term (current) use of oral hypoglycemic drugs: Secondary | ICD-10-CM | POA: Insufficient documentation

## 2016-03-09 LAB — BASIC METABOLIC PANEL
ANION GAP: 7 (ref 5–15)
BUN: 13 mg/dL (ref 6–20)
CO2: 27 mmol/L (ref 22–32)
Calcium: 9.4 mg/dL (ref 8.9–10.3)
Chloride: 104 mmol/L (ref 101–111)
Creatinine, Ser: 0.62 mg/dL (ref 0.44–1.00)
GFR calc non Af Amer: >60 mL/min (ref >60)
Glucose, Bld: 169 mg/dL — ABNORMAL HIGH (ref 65–99)
POTASSIUM: 3.9 mmol/L (ref 3.5–5.1)
Sodium: 138 mmol/L (ref 135–145)

## 2016-03-09 LAB — CBC
HCT: 40.9 % (ref 36.0–46.0)
Hemoglobin: 13.4 g/dL (ref 12.0–15.0)
MCH: 26.7 pg (ref 26.0–34.0)
MCHC: 32.8 g/dL (ref 30.0–36.0)
MCV: 81.5 fL (ref 78.0–100.0)
PLATELETS: 257 10*3/uL (ref 150–400)
RBC: 5.02 MIL/uL (ref 3.87–5.11)
RDW: 15.1 % (ref 11.5–15.5)
WBC: 10.7 10*3/uL — AB (ref 4.0–10.5)

## 2016-03-09 LAB — I-STAT TROPONIN, ED: Troponin i, poc: 0 ng/mL (ref 0.00–0.08)

## 2016-03-09 MED ORDER — GI COCKTAIL ~~LOC~~
30.0000 mL | Freq: Once | ORAL | Status: AC
  Administered 2016-03-09: 30 mL via ORAL
  Filled 2016-03-09: qty 30

## 2016-03-09 MED ORDER — ASPIRIN 81 MG PO CHEW
324.0000 mg | CHEWABLE_TABLET | Freq: Once | ORAL | Status: AC
  Administered 2016-03-09: 324 mg via ORAL
  Filled 2016-03-09: qty 4

## 2016-03-09 MED ORDER — OMEPRAZOLE 20 MG PO CPDR: 20.0000 mg | DELAYED_RELEASE_CAPSULE | Freq: Every day | ORAL | 1 refills | Status: DC

## 2016-03-09 NOTE — ED Notes (Signed)
Upon entering the room to discharge patient, pt and visitor were not in the room, all monitoring equipment was on the bed.

## 2016-03-09 NOTE — Discharge Instructions (Signed)
We saw you in the ER for the chest pain/shortness of breath. All of our cardiac workup is normal, including labs, EKG and chest X-RAY are normal. We are not sure what is causing your discomfort, but we feel comfortable sending you home at this time. The workup in the ER is not complete, and you should follow up with your primary care doctor for further evaluation.  

## 2016-03-09 NOTE — ED Triage Notes (Signed)
Pt. reports intermittent central chest pain with mild SOB and nausea onset this evening , denies emesis or diaphoresis .

## 2016-03-09 NOTE — ED Provider Notes (Addendum)
[facility] DEPT Provider Note   CSN: [id number] Arrival date & time: 03/09/16  4259  First Provider Contact:  First MD Initiated Contact with Patient 03/09/16 0545        History   Chief Complaint Chief Complaint  Patient presents with  . Chest Pain    HPI Marisa Davis is a 45 y.o. female.  HPI Pt comes in with cc of chest pain Pt has hx of DM. She reports that her current pain started last night. Pain is substernal and radiates to her throat. Pain started unprovoked. No specific aggravating or relieving factors. Pt has no associated nausea, dib, diaphoresis. No hx of GERD. No cough. Pt has no hx of PE, DVT and denies any exogenous estrogen use, long distance travels or surgery in the past 6 weeks, active cancer, recent immobilization. No premature CAD in the family.   ROS 10 Systems reviewed and are negative for acute change except as noted in the HPI.     Past Medical History:  Diagnosis Date  . Anxiety   . Diabetes mellitus   . Microalbuminuria    History of, currently resolved    Patient Active Problem List   Diagnosis Date Noted  . DM2 (diabetes mellitus, type 2) ([place]) 10/07/2014  . Preventative health care 10/07/2014  . Hyperpigmentation of skin of cheek 12/03/2012  . Vaginal dryness 06/04/2012  . Proteinuria 12/09/2011  . ANXIETY DEPRESSION 10/17/2007  . HLD (hyperlipidemia) 10/05/2006  . OBESITY, NOS 10/05/2006    History reviewed. No pertinent surgical history.  OB History    No data available       Home Medications    Prior to Admission medications   Medication Sig Start Date End Date Taking? Authorizing Provider  aspirin EC 81 MG EC tablet Take 81 mg by mouth every morning.     Yes Historical Provider, MD  enalapril (VASOTEC) 5 MG tablet TAKE 1 TABLET BY MOUTH DAILY 02/19/16  Yes Lupita Dawn, MD  glipiZIDE (GLUCOTROL XL) 2.5 MG 24 hr tablet Take 1 tablet (2.5 mg total) by mouth daily with breakfast. 12/22/15  Yes Alveda Reasons, MD    metFORMIN (GLUCOPHAGE) 1000 MG tablet TAKE 1 TABLET BY MOUTH TWICE DAILY WITH MEALS 02/19/16  Yes Lupita Dawn, MD  glucose monitoring kit (FREESTYLE) monitoring kit Glucometer,test strips,lancets - test blood sugar 1-2 times daily- disp QS of each.     Historical Provider, MD  omeprazole (PRILOSEC) 20 MG capsule Take 1 capsule (20 mg total) by mouth daily. 03/09/16   Varney Biles, MD    Family History Family History  Problem Relation Age of Onset  . Diabetes Mother   . Stroke Mother 34  . Diabetes Sister   . Diabetes Sister   . Diabetes Sister   . Heart disease Neg Hx     Social History Social History  Substance Use Topics  . Smoking status: Never Smoker  . Smokeless tobacco: Never Used  . Alcohol use No     Allergies   Review of patient's allergies indicates no known allergies.   Review of Systems Review of Systems   Physical Exam Updated Vital Signs BP 132/86   Pulse 86   Temp 98.5 F (36.9 C) (Oral)   Resp 19   Ht 5' 5"  (1.651 m)   Wt 185 lb (83.9 kg)   LMP 09/29/2014   SpO2 100%   BMI 30.79 kg/m   Physical Exam  Constitutional: She is oriented to person, place, and  time. She appears well-developed and well-nourished.  HENT:  Head: Normocephalic and atraumatic.  Eyes: EOM are normal. Pupils are equal, round, and reactive to light.  Neck: Normal range of motion. Neck supple.  Cardiovascular: Normal rate, regular rhythm and normal heart sounds.   No murmur heard. Pulmonary/Chest: Effort normal. No respiratory distress.  Abdominal: Soft. Bowel sounds are normal. She exhibits no distension. There is no tenderness. There is no rebound and no guarding.  Neurological: She is alert and oriented to person, place, and time.  Skin: Skin is warm and dry. Capillary refill takes less than 2 seconds.  Nursing note and vitals reviewed.    ED Treatments / Results  Labs (all labs ordered are listed, but only abnormal results are displayed) Labs Reviewed  BASIC  METABOLIC PANEL - Abnormal; Notable for the following:       Result Value   Glucose, Bld 169 (*)    All other components within normal limits  CBC - Abnormal; Notable for the following:    WBC 10.7 (*)    All other components within normal limits  I-STAT TROPOININ, ED  I-STAT TROPOININ, ED    EKG  EKG Interpretation  Date/Time:  Wednesday March 09 2016 01:45:50 EDT Ventricular Rate:  86 PR Interval:  136 QRS Duration: 78 QT Interval:  358 QTC Calculation: 428 R Axis:   65 Text Interpretation:  Normal sinus rhythm with sinus arrhythmia Right atrial enlargement T wave abnormality, consider inferior ischemia Abnormal ECG No acute changes Nonspecific T wave abnormality in lead III and avF and lateral leads No old tracing to compare Reconfirmed by Kathrynn Humble, MD, [facility] Comp [phone]) on 03/09/2016 8:16:59 AM       Radiology Dg Chest 2 View  Result Date: 03/09/2016 CLINICAL DATA:  45 year old female with chest pain EXAM: CHEST  2 VIEW COMPARISON:  None. FINDINGS: The heart size and mediastinal contours are within normal limits. Both lungs are clear. The visualized skeletal structures are unremarkable. IMPRESSION: No active cardiopulmonary disease. Electronically Signed   By: Anner Crete M.D.   On: 03/09/2016 02:10    Procedures Procedures (including critical care time)  Medications Ordered in ED Medications  gi cocktail (Maalox,Lidocaine,Donnatal) (30 mLs Oral Given 03/09/16 0745)  aspirin chewable tablet 324 mg (324 mg Oral Given 03/09/16 0745)     Initial Impression / Assessment and Plan / ED Course  I have reviewed the triage vital signs and the nursing notes.  Pertinent labs & imaging results that were available during my care of the patient were reviewed by me and considered in my medical decision making (see chart for details).  Clinical Course    Pt comes in with substernal chest pain. Pain is atypical.  Differential diagnosis includes: ACS  syndrome Myocarditis Pericarditis Pneumonia PE Musculoskeletal pain  PUD Esophageal spasms  HEAR score is 3 - 1 for age and 2 got risk factors. ekg has T wave inversions, but no ST changes which would qualify for any points. Plan is to get trops x 2. ASA and Gi cocktail given - the latter helping with the pain. Still - ACS can be possible, as patient is a diabetic and female - thus even if the workup is neg, Cards f/u and strict ER return precautions will be discussed.  @8 :15 Strict ER return precautions have been discussed, and patient is agreeing with the plan and is comfortable with the workup done and the recommendations from the ER.   Final Clinical Impressions(s) / ED Diagnoses   Final  diagnoses:  Atypical chest pain    New Prescriptions New Prescriptions   OMEPRAZOLE (PRILOSEC) 20 MG CAPSULE    Take 1 capsule (20 mg total) by mouth daily.     Varney Biles, MD 03/09/16 0689    Varney Biles, MD 03/09/16 [phone]

## 2016-04-01 ENCOUNTER — Ambulatory Visit (INDEPENDENT_AMBULATORY_CARE_PROVIDER_SITE_OTHER): Payer: Managed Care, Other (non HMO) | Admitting: Family Medicine

## 2016-04-01 ENCOUNTER — Encounter: Payer: Self-pay | Admitting: Family Medicine

## 2016-04-01 VITALS — BP 140/89 | HR 84 | Temp 98.4°F | Wt 182.0 lb

## 2016-04-01 DIAGNOSIS — E119 Type 2 diabetes mellitus without complications: Secondary | ICD-10-CM

## 2016-04-01 DIAGNOSIS — K219 Gastro-esophageal reflux disease without esophagitis: Secondary | ICD-10-CM

## 2016-04-01 LAB — POCT GLYCOSYLATED HEMOGLOBIN (HGB A1C): Hemoglobin A1C: 9.8

## 2016-04-01 MED ORDER — OMEPRAZOLE 20 MG PO CPDR: 20.0000 mg | DELAYED_RELEASE_CAPSULE | Freq: Every day | ORAL | 1 refills | Status: DC

## 2016-04-01 MED ORDER — OMEPRAZOLE 20 MG PO CPDR: 20.0000 mg | DELAYED_RELEASE_CAPSULE | Freq: Every day | ORAL | 3 refills | Status: DC

## 2016-04-01 NOTE — Patient Instructions (Signed)
Please start omeprazole 40mg  and please make sure to take it every day, it takes a few days to take effect. Take over the counter zantac for the next few days until the omeprazole starts working.    Gastroesophageal Reflux Disease, Adult Normally, food travels down the esophagus and stays in the stomach to be digested. However, when a person has gastroesophageal reflux disease (GERD), food and stomach acid move back up into the esophagus. When this happens, the esophagus becomes sore and inflamed. Over time, GERD can create small holes (ulcers) in the lining of the esophagus.  CAUSES This condition is caused by a problem with the muscle between the esophagus and the stomach (lower esophageal sphincter, or LES). Normally, the LES muscle closes after food passes through the esophagus to the stomach. When the LES is weakened or abnormal, it does not close properly, and that allows food and stomach acid to go back up into the esophagus. The LES can be weakened by certain dietary substances, medicines, and medical conditions, including:  Tobacco use.  Pregnancy.  Having a hiatal hernia.  Heavy alcohol use.  Certain foods and beverages, such as coffee, chocolate, onions, and peppermint. RISK FACTORS This condition is more likely to develop in:  People who have an increased body weight.  People who have connective tissue disorders.  People who use NSAID medicines. SYMPTOMS Symptoms of this condition include:  Heartburn.  Difficult or painful swallowing.  The feeling of having a lump in the throat.  Abitter taste in the mouth.  Bad breath.  Having a large amount of saliva.  Having an upset or bloated stomach.  Belching.  Chest pain.  Shortness of breath or wheezing.  Ongoing (chronic) cough or a night-time cough.  Wearing away of tooth enamel.  Weight loss. Different conditions can cause chest pain. Make sure to see your health care provider if you experience chest  pain. DIAGNOSIS Your health care provider will take a medical history and perform a physical exam. To determine if you have mild or severe GERD, your health care provider may also monitor how you respond to treatment. You may also have other tests, including:  An endoscopy toexamine your stomach and esophagus with a small camera.  A test thatmeasures the acidity level in your esophagus.  A test thatmeasures how much pressure is on your esophagus.  A barium swallow or modified barium swallow to show the shape, size, and functioning of your esophagus. TREATMENT The goal of treatment is to help relieve your symptoms and to prevent complications. Treatment for this condition may vary depending on how severe your symptoms are. Your health care provider may recommend:  Changes to your diet.  Medicine.  Surgery. HOME CARE INSTRUCTIONS Diet  Follow a diet as recommended by your health care provider. This may involve avoiding foods and drinks such as:  Coffee and tea (with or without caffeine).  Drinks that containalcohol.  Energy drinks and sports drinks.  Carbonated drinks or sodas.  Chocolate and cocoa.  Peppermint and mint flavorings.  Garlic and onions.  Horseradish.  Spicy and acidic foods, including peppers, chili powder, curry powder, vinegar, hot sauces, and barbecue sauce.  Citrus fruit juices and citrus fruits, such as oranges, lemons, and limes.  Tomato-based foods, such as red sauce, chili, salsa, and pizza with red sauce.  Fried and fatty foods, such as donuts, french fries, potato chips, and high-fat dressings.  High-fat meats, such as hot dogs and fatty cuts of red and white meats,  such as rib eye steak, sausage, ham, and bacon.  High-fat dairy items, such as whole milk, butter, and cream cheese.  Eat small, frequent meals instead of large meals.  Avoid drinking large amounts of liquid with your meals.  Avoid eating meals during the 2-3 hours before  bedtime.  Avoid lying down right after you eat.  Do not exercise right after you eat. General Instructions  Pay attention to any changes in your symptoms.  Take over-the-counter and prescription medicines only as told by your health care provider. Do not take aspirin, ibuprofen, or other NSAIDs unless your health care provider told you to do so.  Do not use any tobacco products, including cigarettes, chewing tobacco, and e-cigarettes. If you need help quitting, ask your health care provider.  Wear loose-fitting clothing. Do not wear anything tight around your waist that causes pressure on your abdomen.  Raise (elevate) the head of your bed 6 inches (15cm).  Try to reduce your stress, such as with yoga or meditation. If you need help reducing stress, ask your health care provider.  If you are overweight, reduce your weight to an amount that is healthy for you. Ask your health care provider for guidance about a safe weight loss goal.  Keep all follow-up visits as told by your health care provider. This is important. SEEK MEDICAL CARE IF:  You have new symptoms.  You have unexplained weight loss.  You have difficulty swallowing, or it hurts to swallow.  You have wheezing or a persistent cough.  Your symptoms do not improve with treatment.  You have a hoarse voice. SEEK IMMEDIATE MEDICAL CARE IF:  You have pain in your arms, neck, jaw, teeth, or back.  You feel sweaty, dizzy, or light-headed.  You have chest pain or shortness of breath.  You vomit and your vomit looks like blood or coffee grounds.  You faint.  Your stool is bloody or black.  You cannot swallow, drink, or eat.   This information is not intended to replace advice given to you by your health care provider. Make sure you discuss any questions you have with your health care provider.   Document Released: 05/04/2005 Document Revised: 04/15/2015 Document Reviewed: 11/19/2014 Elsevier Interactive Patient  Education [employer].

## 2016-04-01 NOTE — Progress Notes (Signed)
Subjective:    Patient ID: Marisa Davis , female   DOB: 06/02/1971 , 45 y.o..   MRN: [id number]  HPI  Chaka D Sanag[place]in is here for reflux. States has had midepigastric pain that radiates up into neck with sour taste in mouth. Went to ED on 03/09/16 thinking was chest pain but was told that was reflux and to start omeprazole but went to pharmacy and medication was not there. Has had 2 further episodes over the last 3 weeks, both after large greasy meals (chicken parm and fried chicken) at night before bed. Last episode was last night and tried mylanta and tums but vomited, no blood in vomit.    Review of Systems: Per HPI. Denies abd pain, nausea, chest pain, palpitations, SOB, diarrhea, constipation.  Health Maintenance Due  Topic Date Due  . PAP SMEAR  06/05/2015  . OPHTHALMOLOGY EXAM  11/20/2015  . INFLUENZA VACCINE  03/08/2016    Past Medical History: Patient Active Problem List   Diagnosis Date Noted  . Esophageal reflux 04/01/2016  . DM2 (diabetes mellitus, type 2) ([place]) 10/07/2014  . Preventative health care 10/07/2014  . Hyperpigmentation of skin of cheek 12/03/2012  . Vaginal dryness 06/04/2012  . Proteinuria 12/09/2011  . ANXIETY DEPRESSION 10/17/2007  . HLD (hyperlipidemia) 10/05/2006  . OBESITY, NOS 10/05/2006    Medications: reviewed and updated Current Outpatient Prescriptions  Medication Sig Dispense Refill  . aspirin EC 81 MG EC tablet Take 81 mg by mouth every morning.      . enalapril (VASOTEC) 5 MG tablet TAKE 1 TABLET BY MOUTH DAILY 30 tablet 2  . glipiZIDE (GLUCOTROL XL) 2.5 MG 24 hr tablet Take 1 tablet (2.5 mg total) by mouth daily with breakfast. 30 tablet 3  . glucose monitoring kit (FREESTYLE) monitoring kit Glucometer,test strips,lancets - test blood sugar 1-2 times daily- disp QS of each.     . metFORMIN (GLUCOPHAGE) 1000 MG tablet TAKE 1 TABLET BY MOUTH TWICE DAILY WITH MEALS 60 tablet 2   No current facility-administered medications for this  visit.     Social Hx:  reports that she has never smoked. She has never used smokeless tobacco.    Objective:   BP 140/89   Pulse 84   Temp 98.4 F (36.9 C) (Oral)   Wt 82.6 kg (182 lb)   LMP 09/29/2014   SpO2 99%   BMI 30.29 kg/m  Physical Exam  Gen: NAD, alert, cooperative with exam, well-appearing HEENT: NCAT, PERRL, clear conjunctiva, supple neck Cardiac: Regular rate and rhythm, normal S1/S2, no murmur, no edema, capillary refill brisk  Respiratory: Clear to auscultation bilaterally, no wheezes, non-labored Gastrointestinal: soft, non tender, non distended, bowel sounds present Skin: no rashes, normal turgor  Neurological: no gross deficits.  Psych: good insight, normal mood and affect   Assessment & Plan:  Esophageal reflux Chest pain is midepigastric after large greasy meals so start omeprazole 77m qd. Patient to try OTC zantac over the next few days until PPI starts working.  If no improvement on PPI consider other workup including possible outpatient stress test.   Follow up: as needed

## 2016-04-01 NOTE — Assessment & Plan Note (Addendum)
Chest pain is midepigastric after large greasy meals so start omeprazole 20mg  qd. Patient to try OTC zantac over the next few days until PPI starts working.  If no improvement on PPI consider other workup including possible outpatient stress test.

## 2016-04-01 NOTE — Progress Notes (Signed)
Patient is a diabetic, here for office visit unrelated to her diabetes. Will need to schedule visit to discuss elevated a1c.

## 2016-04-13 ENCOUNTER — Telehealth: Payer: Self-pay | Admitting: Family Medicine

## 2016-04-13 NOTE — Telephone Encounter (Signed)
Pt is calling to cancel her appointment for 04/18/16. She is starting a new job and will be working Monday Through Friday> she said that this was a f/u appt and didn't know if this is important or can she reschedule in a month or two. Please call jw

## 2016-04-14 NOTE — Telephone Encounter (Signed)
Reviewed chart. I've not met Marisa Davis yet, she was reassigned to me recently. Appointment appears to be to follow up on the chest discomfort/acid reflux, also to follow up on diabetes and get to know new PCP. If she is feeling well then it's not urgent she be seen, but it would be a good idea to come in in the next 1-2 months. Please let her know. Thanks,  Leeanne Rio, MD

## 2016-04-15 NOTE — Telephone Encounter (Signed)
LMOVM for pt to call us back. Massiah Longanecker, CMA  

## 2016-04-18 ENCOUNTER — Ambulatory Visit: Payer: Managed Care, Other (non HMO) | Admitting: Family Medicine

## 2016-04-27 ENCOUNTER — Other Ambulatory Visit: Payer: Self-pay | Admitting: Family Medicine

## 2016-07-29 ENCOUNTER — Other Ambulatory Visit: Payer: Self-pay | Admitting: Family Medicine

## 2016-12-06 ENCOUNTER — Ambulatory Visit (INDEPENDENT_AMBULATORY_CARE_PROVIDER_SITE_OTHER): Payer: BLUE CROSS/BLUE SHIELD | Admitting: Family Medicine

## 2016-12-06 ENCOUNTER — Other Ambulatory Visit (HOSPITAL_COMMUNITY)
Admission: RE | Admit: 2016-12-06 | Discharge: 2016-12-06 | Disposition: A | Discharge status: Home / Self Care | Payer: BLUE CROSS/BLUE SHIELD | Source: Ambulatory Visit | Attending: Family Medicine | Admitting: Family Medicine

## 2016-12-06 VITALS — BP 130/80 | HR 83 | Temp 98.4°F | Ht 65.0 in | Wt 166.0 lb

## 2016-12-06 DIAGNOSIS — R809 Proteinuria, unspecified: Secondary | ICD-10-CM

## 2016-12-06 DIAGNOSIS — B351 Tinea unguium: Secondary | ICD-10-CM

## 2016-12-06 DIAGNOSIS — Z0000 Encounter for general adult medical examination without abnormal findings: Secondary | ICD-10-CM | POA: Diagnosis not present

## 2016-12-06 DIAGNOSIS — E1129 Type 2 diabetes mellitus with other diabetic kidney complication: Secondary | ICD-10-CM | POA: Diagnosis not present

## 2016-12-06 DIAGNOSIS — E785 Hyperlipidemia, unspecified: Secondary | ICD-10-CM

## 2016-12-06 LAB — POCT GLYCOSYLATED HEMOGLOBIN (HGB A1C): Hemoglobin A1C: 8.8

## 2016-12-06 LAB — RESULTS CONSOLE HPV: CHL HPV: NEGATIVE

## 2016-12-06 MED ORDER — TERBINAFINE HCL 250 MG PO TABS: 250.0000 mg | ORAL_TABLET | Freq: Every day | ORAL | 0 refills | Status: DC

## 2016-12-06 MED ORDER — MICROLET LANCETS MISC: 3 refills | Status: DC

## 2016-12-06 MED ORDER — GLUCOSE BLOOD VI STRP: ORAL_STRIP | 12 refills | Status: DC

## 2016-12-06 MED ORDER — BAYER CONTOUR NEXT MONITOR W/DEVICE KIT: PACK | 0 refills | Status: DC

## 2016-12-06 MED ORDER — MICROLET NEXT LANCING DEVICE MISC: 1.0000 | Freq: Every day | 0 refills | Status: DC

## 2016-12-06 NOTE — Patient Instructions (Addendum)
On your way out, schedule an appointment one morning to come back for fasting labs. Do not eat or drink anything other than water the morning of your lab appointment until after your labs are drawn.   Make the appointment after 12/22/16. Will check your liver, kidneys, and cholesterol  Pap smear today A1c today  See the handout on how to schedule your mammogram. This is an important test to screen for breast cancer.  Schedule your diabetic eye exam  For toenail fungus - start terbinafine one pill daily for 12 weeks Very important to check your liver in a few weeks  Follow up with me in 3 months  Be well, Dr. Ardelia Mems   Health Maintenance, Female Adopting a healthy lifestyle and getting preventive care can go a long way to promote health and wellness. Talk with your health care provider about what schedule of regular examinations is right for you. This is a good chance for you to check in with your provider about disease prevention and staying healthy. In between checkups, there are plenty of things you can do on your own. Experts have done a lot of research about which lifestyle changes and preventive measures are most likely to keep you healthy. Ask your health care provider for more information. Weight and diet Eat a healthy diet  Be sure to include plenty of vegetables, fruits, low-fat dairy products, and lean protein.  Do not eat a lot of foods high in solid fats, added sugars, or salt.  Get regular exercise. This is one of the most important things you can do for your health.  Most adults should exercise for at least 150 minutes each week. The exercise should increase your heart rate and make you sweat (moderate-intensity exercise).  Most adults should also do strengthening exercises at least twice a week. This is in addition to the moderate-intensity exercise. Maintain a healthy weight  Body mass index (BMI) is a measurement that can be used to identify possible weight  problems. It estimates body fat based on height and weight. Your health care provider can help determine your BMI and help you achieve or maintain a healthy weight.  For females 63 years of age and older:  A BMI below 18.5 is considered underweight.  A BMI of 18.5 to 24.9 is normal.  A BMI of 25 to 29.9 is considered overweight.  A BMI of 30 and above is considered obese. Watch levels of cholesterol and blood lipids  You should start having your blood tested for lipids and cholesterol at 47 years of age, then have this test every 5 years.  You may need to have your cholesterol levels checked more often if:  Your lipid or cholesterol levels are high.  You are older than 47 years of age.  You are at high risk for heart disease. Cancer screening Lung Cancer  Lung cancer screening is recommended for adults 79-37 years old who are at high risk for lung cancer because of a history of smoking.  A yearly low-dose CT scan of the lungs is recommended for people who:  Currently smoke.  Have quit within the past 15 years.  Have at least a 30-pack-year history of smoking. A pack year is smoking an average of one pack of cigarettes a day for 1 year.  Yearly screening should continue until it has been 15 years since you quit.  Yearly screening should stop if you develop a health problem that would prevent you from having lung cancer treatment.  Breast Cancer  Practice breast self-awareness. This means understanding how your breasts normally appear and feel.  It also means doing regular breast self-exams. Let your health care provider know about any changes, no matter how small.  If you are in your 20s or 30s, you should have a clinical breast exam (CBE) by a health care provider every 1-3 years as part of a regular health exam.  If you are 59 or older, have a CBE every year. Also consider having a breast X-ray (mammogram) every year.  If you have a family history of breast cancer,  talk to your health care provider about genetic screening.  If you are at high risk for breast cancer, talk to your health care provider about having an MRI and a mammogram every year.  Breast cancer gene (BRCA) assessment is recommended for women who have family members with BRCA-related cancers. BRCA-related cancers include:  Breast.  Ovarian.  Tubal.  Peritoneal cancers.  Results of the assessment will determine the need for genetic counseling and BRCA1 and BRCA2 testing. Cervical Cancer  Your health care provider may recommend that you be screened regularly for cancer of the pelvic organs (ovaries, uterus, and vagina). This screening involves a pelvic examination, including checking for microscopic changes to the surface of your cervix (Pap test). You may be encouraged to have this screening done every 3 years, beginning at age 78.  For women ages 13-65, health care providers may recommend pelvic exams and Pap testing every 3 years, or they may recommend the Pap and pelvic exam, combined with testing for human papilloma virus (HPV), every 5 years. Some types of HPV increase your risk of cervical cancer. Testing for HPV may also be done on women of any age with unclear Pap test results.  Other health care providers may not recommend any screening for nonpregnant women who are considered low risk for pelvic cancer and who do not have symptoms. Ask your health care provider if a screening pelvic exam is right for you.  If you have had past treatment for cervical cancer or a condition that could lead to cancer, you need Pap tests and screening for cancer for at least 20 years after your treatment. If Pap tests have been discontinued, your risk factors (such as having a new sexual partner) need to be reassessed to determine if screening should resume. Some women have medical problems that increase the chance of getting cervical cancer. In these cases, your health care provider may recommend more  frequent screening and Pap tests. Colorectal Cancer  This type of cancer can be detected and often prevented.  Routine colorectal cancer screening usually begins at 47 years of age and continues through 47 years of age.  Your health care provider may recommend screening at an earlier age if you have risk factors for colon cancer.  Your health care provider may also recommend using home test kits to check for hidden blood in the stool.  A small camera at the end of a tube can be used to examine your colon directly (sigmoidoscopy or colonoscopy). This is done to check for the earliest forms of colorectal cancer.  Routine screening usually begins at age 14.  Direct examination of the colon should be repeated every 5-10 years through 47 years of age. However, you may need to be screened more often if early forms of precancerous polyps or small growths are found. Skin Cancer  Check your skin from head to toe regularly.  Tell your health  care provider about any new moles or changes in moles, especially if there is a change in a mole's shape or color.  Also tell your health care provider if you have a mole that is larger than the size of a pencil eraser.  Always use sunscreen. Apply sunscreen liberally and repeatedly throughout the day.  Protect yourself by wearing long sleeves, pants, a wide-brimmed hat, and sunglasses whenever you are outside. Heart disease, diabetes, and high blood pressure  High blood pressure causes heart disease and increases the risk of stroke. High blood pressure is more likely to develop in:  People who have blood pressure in the high end of the normal range (130-139/85-89 mm Hg).  People who are overweight or obese.  People who are African American.  If you are 15-59 years of age, have your blood pressure checked every 3-5 years. If you are 14 years of age or older, have your blood pressure checked every year. You should have your blood pressure measured  twice-once when you are at a hospital or clinic, and once when you are not at a hospital or clinic. Record the average of the two measurements. To check your blood pressure when you are not at a hospital or clinic, you can use:  An automated blood pressure machine at a pharmacy.  A home blood pressure monitor.  If you are between 75 years and 36 years old, ask your health care provider if you should take aspirin to prevent strokes.  Have regular diabetes screenings. This involves taking a blood sample to check your fasting blood sugar level.  If you are at a normal weight and have a low risk for diabetes, have this test once every three years after 47 years of age.  If you are overweight and have a high risk for diabetes, consider being tested at a younger age or more often. Preventing infection Hepatitis B  If you have a higher risk for hepatitis B, you should be screened for this virus. You are considered at high risk for hepatitis B if:  You were born in a country where hepatitis B is common. Ask your health care provider which countries are considered high risk.  Your parents were born in a high-risk country, and you have not been immunized against hepatitis B (hepatitis B vaccine).  You have HIV or AIDS.  You use needles to inject street drugs.  You live with someone who has hepatitis B.  You have had sex with someone who has hepatitis B.  You get hemodialysis treatment.  You take certain medicines for conditions, including cancer, organ transplantation, and autoimmune conditions. Hepatitis C  Blood testing is recommended for:  Everyone born from [birth year] through 1966.  Anyone with known risk factors for hepatitis C. Sexually transmitted infections (STIs)  You should be screened for sexually transmitted infections (STIs) including gonorrhea and chlamydia if:  You are sexually active and are younger than 47 years of age.  You are older than 47 years of age and your  health care provider tells you that you are at risk for this type of infection.  Your sexual activity has changed since you were last screened and you are at an increased risk for chlamydia or gonorrhea. Ask your health care provider if you are at risk.  If you do not have HIV, but are at risk, it may be recommended that you take a prescription medicine daily to prevent HIV infection. This is called pre-exposure prophylaxis (PrEP). You are considered  at risk if:  You are sexually active and do not regularly use condoms or know the HIV status of your partner(s).  You take drugs by injection.  You are sexually active with a partner who has HIV. Talk with your health care provider about whether you are at high risk of being infected with HIV. If you choose to begin PrEP, you should first be tested for HIV. You should then be tested every 3 months for as long as you are taking PrEP. Pregnancy  If you are premenopausal and you may become pregnant, ask your health care provider about preconception counseling.  If you may become pregnant, take 400 to 800 micrograms (mcg) of folic acid every day.  If you want to prevent pregnancy, talk to your health care provider about birth control (contraception). Osteoporosis and menopause  Osteoporosis is a disease in which the bones lose minerals and strength with aging. This can result in serious bone fractures. Your risk for osteoporosis can be identified using a bone density scan.  If you are 39 years of age or older, or if you are at risk for osteoporosis and fractures, ask your health care provider if you should be screened.  Ask your health care provider whether you should take a calcium or vitamin D supplement to lower your risk for osteoporosis.  Menopause may have certain physical symptoms and risks.  Hormone replacement therapy may reduce some of these symptoms and risks. Talk to your health care provider about whether hormone replacement  therapy is right for you. Follow these instructions at home:  Schedule regular health, dental, and eye exams.  Stay current with your immunizations.  Do not use any tobacco products including cigarettes, chewing tobacco, or electronic cigarettes.  If you are pregnant, do not drink alcohol.  If you are breastfeeding, limit how much and how often you drink alcohol.  Limit alcohol intake to no more than 1 drink per day for nonpregnant women. One drink equals 12 ounces of beer, 5 ounces of wine, or 1 ounces of hard liquor.  Do not use street drugs.  Do not share needles.  Ask your health care provider for help if you need support or information about quitting drugs.  Tell your health care provider if you often feel depressed.  Tell your health care provider if you have ever been abused or do not feel safe at home. This information is not intended to replace advice given to you by your health care provider. Make sure you discuss any questions you have with your health care provider. Document Released: 02/07/2011 Document Revised: 12/31/2015 Document Reviewed: 04/28/2015 Elsevier Interactive Patient Education  2017 [employer].

## 2016-12-06 NOTE — Progress Notes (Signed)
Date of Visit: 12/06/2016   HPI:  Patient presents today for a well woman exam.   Concerns today: see below Periods: irregular, no bleeding in between periods Contraception: history of tubal ligation Pelvic symptoms: no vaginal discharge or pelvic pain. Vagina slightly dry. Sexual activity: one female parnter in the last year STD Screening: declines testing today, not concerned for exposure Pap smear status: due later this year, will get today Exercise: has tried to be more active, has lost almost 20lb Diet: tries to eat healthy Smoking: no Alcohol: no Drugs: no Dentist: does not have dentist  Today's concerns: - diabetes - out of glipizide for 3 months. Has worked hard on exercising and eating well and has lost almost 20lb. She is very pleased to see A1c of 8.8, improved from prior. Does not have a meter. Compliant with metformin. No low sugars. Out of enalapril, which she takes for renal protection. - itching skin - has had itchy spot on skin in various places, happening about 1 time per month for the last several months - toenail issues - has thick discolored toenails. Would like medication to treat.  ROS: See HPI  PMFSH:  Cancers in family: none PMHx: hyperlipidemia, obesity, anxiety/depression, proteinuria, type 2 diabetes   PHYSICAL EXAM: BP 130/80 (BP Location: Right Arm, Patient Position: Sitting, Cuff Size: Normal)   Pulse 83   Temp 98.4 F (36.9 C) (Oral)   Ht  (1.651 m)   Wt 166 lb (75.3 kg)   LMP 09/29/2014   SpO2 99%   BMI 27.62 kg/m  Gen: NAD, pleasant, cooperative HEENT: NCAT, PERRL, no palpable thyromegaly or anterior cervical lymphadenopathy Heart: RRR, no murmurs Lungs: CTAB, NWOB Abdomen: soft, nontender to palpation Neuro: grossly nonfocal, speech normal GU: normal appearing external genitalia without lesions. Vagina is moist with white discharge. Cervix normal in appearance. No cervical motion tenderness or tenderness on bimanual exam. No  adnexal masses.  Diabetic foot exam: 2+ DP pulses bilat, normal monofilament testing bilaterally. No lesions or significant calluses. Toenails thickened and discolored, consistent with onychomycosis. Skin: no rashes or lesions  ASSESSMENT/PLAN:  Health maintenance:  -STD screening: declines -pap smear: done today -mammogram: needs handout - will ask staff to mail to her as we forgot to give this to her today -lipid screening: checking in a few weeks -immunizations: UTD -handout given on health maintenance topics  Itchy skin - mild, likely bug bite. Recommend over the counter topical hydrocortisone as needed.  DM2 (diabetes mellitus, type 2) Congratulated patient on significant strides she's made in controlling her diabetes, even while out of glipizide Will refill this today. Hopefully will get her closer down to goal. Follow up in 3 months  Cardiac: on aspirin. Check lipids in 3 weeks Renal: restart enalapril today (ran out) - will check renal function with labs in a few weeks Eye: counseled to get exam Foot: normal exam today except onychomycosis, will treat with terbinafine Immunizations: UTD   HLD (hyperlipidemia) Check lipids in 3 weeks (will be >1 year since last lipid check at that point)  Onychomycosis Treat with terbinafine x12 weeks Return in several weeks for LFTs   FOLLOW UP: Follow up in 3 mos for above issues  [place] J. Pollie Meyer, MD [facility]

## 2016-12-08 ENCOUNTER — Encounter: Payer: Self-pay | Admitting: Family Medicine

## 2016-12-08 DIAGNOSIS — B351 Tinea unguium: Secondary | ICD-10-CM | POA: Insufficient documentation

## 2016-12-08 LAB — CYTOLOGY - PAP
DIAGNOSIS: NEGATIVE
HPV (WINDOPATH): NOT DETECTED

## 2016-12-08 NOTE — Assessment & Plan Note (Signed)
Congratulated patient on significant strides she's made in controlling her diabetes, even while out of glipizide Will refill this today. Hopefully will get her closer down to goal. Follow up in 3 months  Cardiac: on aspirin. Check lipids in 3 weeks Renal: restart enalapril today (ran out) - will check renal function with labs in a few weeks Eye: counseled to get exam Foot: normal exam today except onychomycosis, will treat with terbinafine Immunizations: UTD

## 2016-12-08 NOTE — Assessment & Plan Note (Signed)
Check lipids in 3 weeks (will be >1 year since last lipid check at that point)

## 2016-12-08 NOTE — Assessment & Plan Note (Signed)
Treat with terbinafine x12 weeks Return in several weeks for LFTs

## 2016-12-28 ENCOUNTER — Other Ambulatory Visit: Payer: Self-pay | Admitting: Family Medicine

## 2016-12-28 DIAGNOSIS — E118 Type 2 diabetes mellitus with unspecified complications: Secondary | ICD-10-CM

## 2016-12-28 MED ORDER — ENALAPRIL MALEATE 5 MG PO TABS: 5.0000 mg | ORAL_TABLET | Freq: Every day | ORAL | 2 refills | Status: DC

## 2016-12-28 MED ORDER — GLIPIZIDE ER 2.5 MG PO TB24: ORAL_TABLET | ORAL | 5 refills | Status: DC

## 2016-12-28 MED ORDER — METFORMIN HCL 1000 MG PO TABS
1000.0000 mg | ORAL_TABLET | Freq: Two times a day (BID) | ORAL | 5 refills | Status: DC
Start: 2016-12-28 — End: 2017-10-10

## 2016-12-28 NOTE — Telephone Encounter (Signed)
Pt calling to request refill of:  Name of Medication(s): metformin, glipizide, and enalapril  Last date of OV: 12-06-16 Pharmacy:  [facility]  Will route refill request to Clinic RN.  Discussed with patient policy to call pharmacy for future refills.  Also, discussed refills may take up to 48 hours to approve or deny.  Markus JarvisEmily C Pittman

## 2017-06-27 DIAGNOSIS — Z1231 Encounter for screening mammogram for malignant neoplasm of breast: Secondary | ICD-10-CM | POA: Diagnosis not present

## 2017-09-29 ENCOUNTER — Encounter: Payer: Self-pay | Admitting: Family Medicine

## 2017-09-29 ENCOUNTER — Ambulatory Visit: Payer: Managed Care, Other (non HMO) | Admitting: Family Medicine

## 2017-09-29 ENCOUNTER — Other Ambulatory Visit: Payer: Self-pay

## 2017-09-29 VITALS — BP 124/78 | HR 75 | Temp 98.4°F | Ht 65.0 in | Wt 180.4 lb

## 2017-09-29 DIAGNOSIS — E119 Type 2 diabetes mellitus without complications: Secondary | ICD-10-CM | POA: Diagnosis not present

## 2017-09-29 DIAGNOSIS — E785 Hyperlipidemia, unspecified: Secondary | ICD-10-CM | POA: Diagnosis not present

## 2017-09-29 LAB — POCT GLYCOSYLATED HEMOGLOBIN (HGB A1C): HEMOGLOBIN A1C: 9.8

## 2017-09-29 MED ORDER — GLIPIZIDE ER 5 MG PO TB24: 5.0000 mg | ORAL_TABLET | Freq: Every day | ORAL | 1 refills | Status: DC

## 2017-09-29 NOTE — Patient Instructions (Signed)
It was great to see you again today!  Increase glipizide to 5mg  daily - sent in new prescription to [facility] Schedule your diabetic eye exam  Labs & urine testing today  Try heating pad for your shoulder  Follow up in 3 months with me, sooner if needed  Be well, Dr. Pollie MeyerMcIntyre

## 2017-09-29 NOTE — Progress Notes (Signed)
Date of Visit: 09/29/2017   HPI:  Patient presents for routine follow-up.  Diabetes: Currently taking glipizide 2.5 mg daily, and metformin 1000 mg twice a day.  Is not taking enalapril anymore.  Denies any low sugars.  Does not check her sugar at home.  A1c today is 9.8, up from 8.8 last May.  Due for eye exam.  Hyperlipidemia: Not currently taking a statin.  Is fasting today and agreeable to getting blood work.  Right shoulder pain: Mentioned end of visit.  Endorses some muscle tightness in her right shoulder for the last few weeks.  Not particularly bothersome.  Located in her posterior shoulder/neck.  ROS: See HPI.  PMFSH: history of hyperlipidemia, type 2 diabetes, GERD, anxiety/depression, obesity  PHYSICAL EXAM: BP 124/78   Pulse 75   Temp 98.4 F (36.9 C) (Oral)   Ht 5\' 5"  (1.651 m)   Wt 180 lb 6.4 oz (81.8 kg)   LMP 09/29/2014   SpO2 98%   BMI 30.02 kg/m  Gen: No acute distress, pleasant, cooperative HEENT: Normocephalic, atraumatic Heart: Regular rate and rhythm Lungs: Clear bilaterally, normal effort Neuro: Alert, grossly nonfocal, speech normal Ext: No edema MSK: Posterior right trapezoid mildly tender to palpation.  ASSESSMENT/PLAN:  Health maintenance:  -Offered flu shot today but patient declined -Advised patient to schedule eye exam  HLD (hyperlipidemia) Check complete metabolic panel and fasting lipids today.  DM2 (diabetes mellitus, type 2) A1c elevated.  Will increase glipizide to 5 mg daily.  Follow-up in 3 months for next A1c.  Encouraged patient to schedule diabetic eye exam.  Check urine microalbumin today as she is no longer taking an ACE inhibitor.  R shoulder muscle ache Mild, brought up at end of visit. Encouraged use of heating pad for relief, follow up if becomes more bothersome or not improving.  FOLLOW UP: Follow up in 3 mos for diabetes   [place]adaBrittany J. Pollie MeyerMcIntyre, MD [facility]

## 2017-09-30 LAB — CMP14+EGFR
ALT: 13 IU/L (ref 0–32)
AST: 14 IU/L (ref 0–40)
Albumin/Globulin Ratio: 1.4 (ref 1.2–2.2)
Albumin: 4 g/dL (ref 3.5–5.5)
Alkaline Phosphatase: 96 IU/L (ref 39–117)
BUN/Creatinine Ratio: 11 (ref 9–23)
BUN: 7 mg/dL (ref 6–24)
Bilirubin Total: 0.7 mg/dL (ref 0.0–1.2)
CALCIUM: 9 mg/dL (ref 8.7–10.2)
CO2: 23 mmol/L (ref 20–29)
CREATININE: 0.62 mg/dL (ref 0.57–1.00)
Chloride: 105 mmol/L (ref 96–106)
GFR calc Af Amer: 124 mL/min/{1.73_m2} (ref >59)
GFR, EST NON AFRICAN AMERICAN: 108 mL/min/{1.73_m2} (ref >59)
Globulin, Total: 2.9 g/dL (ref 1.5–4.5)
Glucose: 210 mg/dL — ABNORMAL HIGH (ref 65–99)
Potassium: 3.9 mmol/L (ref 3.5–5.2)
Sodium: 141 mmol/L (ref 134–144)
Total Protein: 6.9 g/dL (ref 6.0–8.5)

## 2017-09-30 LAB — MICROALBUMIN / CREATININE URINE RATIO
Creatinine, Urine: 96.3 mg/dL
MICROALB/CREAT RATIO: 75.7 mg/g{creat} — AB (ref 0.0–30.0)
MICROALBUM., U, RANDOM: 72.9 ug/mL

## 2017-09-30 LAB — LIPID PANEL
CHOL/HDL RATIO: 4.5 ratio — AB (ref 0.0–4.4)
Cholesterol, Total: 193 mg/dL (ref 100–199)
HDL: 43 mg/dL (ref >39)
LDL CALC: 130 mg/dL — AB (ref 0–99)
TRIGLYCERIDES: 102 mg/dL (ref 0–149)
VLDL Cholesterol Cal: 20 mg/dL (ref 5–40)

## 2017-10-04 NOTE — Assessment & Plan Note (Signed)
A1c elevated.  Will increase glipizide to 5 mg daily.  Follow-up in 3 months for next A1c.  Encouraged patient to schedule diabetic eye exam.  Check urine microalbumin today as she is no longer taking an ACE inhibitor.

## 2017-10-04 NOTE — Assessment & Plan Note (Signed)
Check complete metabolic panel and fasting lipids today.

## 2017-10-06 ENCOUNTER — Telehealth: Payer: Self-pay | Admitting: Family Medicine

## 2017-10-06 NOTE — Telephone Encounter (Signed)
Called patient to discuss lab results. No answer, LVM asking her to call back.  - Elevated urine microalbumin:creat ratio - would benefit from ACE.  - lipids look ok but given diabetes she would benefit from at least moderate intensity statin  I will be out of the office next week but Dr. Deirdre Priesthambliss is covering for me if patient calls back.  Latrelle DodrillBrittany J Nuala Chiles, MD

## 2017-10-10 ENCOUNTER — Other Ambulatory Visit: Payer: Self-pay | Admitting: Family Medicine

## 2017-11-24 ENCOUNTER — Encounter: Payer: Self-pay | Admitting: Family Medicine

## 2017-11-24 NOTE — Telephone Encounter (Signed)
Patient never called back, will send a letter asking her to call or schedule follow up visit. Latrelle DodrillBrittany J Arian Mcquitty, MD

## 2018-01-25 ENCOUNTER — Other Ambulatory Visit: Payer: Self-pay | Admitting: Family Medicine

## 2018-01-29 NOTE — Telephone Encounter (Signed)
Red team, Can you check with patient which dose of glipizide she is taking? At her visit in Feb we increased it to 5mg  but this refill request is for a 2.5mg  pill. Also please ask her to schedule follow up with me, she is well past due.  Thanks Latrelle DodrillBrittany J McIntyre, MD

## 2018-01-31 NOTE — Telephone Encounter (Signed)
LMOVM for pt to call us back. Deseree Blount, CMA  

## 2018-02-06 ENCOUNTER — Telehealth: Payer: Self-pay

## 2018-02-06 NOTE — Telephone Encounter (Signed)
Spoke with pt and she says the glipizide 5mg  is too much for her, so she wants to go back to 2.5mg . She also scheduled a f/u appt with you. Deseree Bruna PotterBlount, CMA

## 2018-02-06 NOTE — Telephone Encounter (Signed)
Patient left message she is returning a call. Did not leave call back number or any further information.  Ples SpecterAlisa Nellie Chevalier, RN [facility] RN)

## 2018-02-09 NOTE — Telephone Encounter (Signed)
Rx sent in for 2.5mg  glipizide pills. Will discuss with patient further at follow up.  Latrelle DodrillBrittany J McIntyre, MD

## 2018-02-20 ENCOUNTER — Ambulatory Visit: Payer: BLUE CROSS/BLUE SHIELD | Admitting: Family Medicine

## 2018-02-20 VITALS — BP 120/75 | HR 86 | Temp 98.8°F | Wt 181.2 lb

## 2018-02-20 DIAGNOSIS — R809 Proteinuria, unspecified: Secondary | ICD-10-CM | POA: Diagnosis not present

## 2018-02-20 DIAGNOSIS — E118 Type 2 diabetes mellitus with unspecified complications: Secondary | ICD-10-CM

## 2018-02-20 DIAGNOSIS — E785 Hyperlipidemia, unspecified: Secondary | ICD-10-CM

## 2018-02-20 LAB — POCT GLYCOSYLATED HEMOGLOBIN (HGB A1C): HbA1c, POC (controlled diabetic range): 10.5 % — AB (ref 0.0–7.0)

## 2018-02-20 MED ORDER — ATORVASTATIN CALCIUM 20 MG PO TABS: 20.0000 mg | ORAL_TABLET | Freq: Every day | ORAL | 3 refills | Status: DC

## 2018-02-20 MED ORDER — EMPAGLIFLOZIN 10 MG PO TABS: 10.0000 mg | ORAL_TABLET | Freq: Every day | ORAL | 3 refills | Status: DC

## 2018-02-20 MED ORDER — LISINOPRIL 5 MG PO TABS: 5.0000 mg | ORAL_TABLET | Freq: Every day | ORAL | 0 refills | Status: DC

## 2018-02-20 NOTE — Progress Notes (Signed)
Date of Visit: 02/20/2018   HPI:  Marisa Davis is a 47 y.o. woman with past medical history of Type II diabetes who is here for diabetes follow up and medication refill; she has no other complaints today.   Diabetes - does not check sugars at home. At last visit her glipizide was increased from 2.5mg  to 5mg  daily but she felt this was too much, so she lowered it back down to 2.5. Had symptoms of hypoglycemia at that time, which improved after lowering back down to 2.5. Denies any recent symptoms of hypoglycemia. Is interested in taking new medication [place] PepperJardiance, has friends on it. Denies chest pain or shortness of breath.  Microalbuminuria - discussed recommendation for adding ACE, she is agreeable. Used to take enalapril but it got too expensive.  Lipids - agreeable to starting a cholesterol medication given dx of diabetes.   ROS: See HPI.  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 She is on her feet most of shift.   PHYSICAL EXAM: BP 120/75   Pulse 86   Temp 98.8 F (37.1 C)   Wt 181 lb 3.2 oz (82.2 kg)   LMP 09/29/2014   SpO2 99%   BMI 30.15 kg/m   Gen: well appearing, alert, interactive  HEENT: ncat Heart: regular rate and rhythm, no murmur Lungs: clear to auscultation bilaterally, normal work of breathing  Neuro: alert, grossly nonfocal, speech normal Ext: normal diabetic foot exam, no wounds  ASSESSMENT/PLAN:   Health maintenance:  -normal foot exam today  DM2 (diabetes mellitus, type 2) Uncontrolled with A1c 10.5, up from 9.8 four months ago. Add jardiance 10mg  daily. Stressed importance of lifestyle changes. Patient motivated to make changes.  Cardiac: on aspirin, add statin Renal: add lisinopril5mg  daily for renal protection, check BMET in 1 week Eye: discuss at next visit Foot: done today, normal Immunizations: UTD   HLD (hyperlipidemia) Add statin given concomitant diabetes dx - atorv 20mg  daily, moderate  intensity  Proteinuria Add lisinopril 5mg  daily, check BMET in 1 week  FOLLOW UP: Follow up in mid-late November for diabetes, sooner if needed  Elveria Risingimelie Horne, Medical Student  Patient seen along with MS3 student Elveria Risingimelie Horne. I personally evaluated this patient along with the student, and verified all aspects of the history, physical exam, and medical decision making as documented by the student. I agree with the student's documentation and have made all necessary edits.   GrenadaBrittany J. Pollie MeyerMcIntyre, MD [facility]

## 2018-02-20 NOTE — Patient Instructions (Addendum)
It was great to see you again today!  Adding medications: - atorvastatin 20mg  daily for cholesterol - jardiance 10mg  daily for diabetes control - lisinopril 5mg  daily to protect kidneys  Continue other medications.   Schedule lab visit in 1 week to check kidney function.  Follow up with me in mid-late November, sooner if needed.  Be well, Dr. Pollie MeyerMcIntyre

## 2018-02-21 NOTE — Assessment & Plan Note (Addendum)
Uncontrolled with A1c 10.5, up from 9.8 four months ago. Add jardiance 10mg  daily. Stressed importance of lifestyle changes. Patient motivated to make changes.  Cardiac: on aspirin, add statin Renal: add lisinopril5mg  daily for renal protection, check BMET in 1 week Eye: discuss at next visit Foot: done today, normal Immunizations: UTD

## 2018-02-21 NOTE — Assessment & Plan Note (Signed)
Add lisinopril 5mg  daily, check BMET in 1 week

## 2018-02-21 NOTE — Assessment & Plan Note (Signed)
Add statin given concomitant diabetes dx - atorv 20mg  daily, moderate intensity

## 2018-04-19 ENCOUNTER — Other Ambulatory Visit: Payer: Self-pay | Admitting: Family Medicine

## 2018-05-01 ENCOUNTER — Other Ambulatory Visit: Payer: Self-pay | Admitting: Family Medicine

## 2018-05-04 ENCOUNTER — Other Ambulatory Visit: Payer: Self-pay

## 2018-05-04 ENCOUNTER — Ambulatory Visit (INDEPENDENT_AMBULATORY_CARE_PROVIDER_SITE_OTHER): Payer: BLUE CROSS/BLUE SHIELD | Admitting: Student in an Organized Health Care Education/Training Program

## 2018-05-04 VITALS — BP 130/86 | HR 84 | Temp 99.3°F | Ht 65.0 in | Wt 170.8 lb

## 2018-05-04 DIAGNOSIS — T50905A Adverse effect of unspecified drugs, medicaments and biological substances, initial encounter: Secondary | ICD-10-CM | POA: Diagnosis not present

## 2018-05-04 NOTE — Patient Instructions (Signed)
It was a pleasure seeing you today in our clinic. Today we discussed your medications. Here is the treatment plan we have discussed and agreed upon together:  STOP lisinopril STOP statin  CONTINUE Jardiance (Empagliflozin) and your other medications   SCHEDULE FOLLOW UP IN 2-4 WEEKS. I would expect your symptoms to resolve by then if they are caused by the medications we are stopping.  Our clinic's number is [phone]. Please call with questions or concerns about what we discussed today.  Be well, Dr. Mosetta Putt

## 2018-05-04 NOTE — Progress Notes (Signed)
   CC: itching and lip swelling  HPI: Marisa Davis is a 47 y.o. female with PMH significant for T2DM, mood disorder, HLD, who presents to [facility] today with itching and lip swelling.  Patient reports that on 7/19 she was seen and started on lisinopril, empagliflozen and atorvastatin. Subsequently, she reports developing generalized itching and mild lip swelling. The itching started within a day of taking the new medications. The lip swelling she noticed on her bottom lip and this started 3 days ago. She discontinued the medications and the itching improved but did not resolve. She additionally reports that the swelling in her lip has improved since stopping these three medications. She has not seen a rash. She has not had any lesions in her mouth or throat swelling.  She has not had any other environmental exposures such as new lotions, soaps, detergents. No new pets. No recent insect bites.   The ACE was started for renal protection in the setting of microalbuminuria.  Review of Symptoms:  See HPI for ROS.   CC, SH/smoking status, and VS noted.  Objective: BP 130/86   Pulse 84   Temp 99.3 F (37.4 C) (Oral)   Ht 5\' 5"  (1.651 m)   Wt 170 lb 12.8 oz (77.5 kg)   LMP 09/29/2014   SpO2 99%   BMI 28.42 kg/m  GEN: NAD, alert, cooperative, and pleasant. EYE: no conjunctival injection, pupils equally round and reactive to light ENMT: normal tympanic light reflex, no nasal polyps,no rhinorrhea, no pharyngeal erythema or exudates. No mouth lesions or ulcers. +mild right lower lip swelling barely appreciable NECK: full ROM RESPIRATORY: clear to auscultation bilaterally with no wheezes, rhonchi or rales, good effort CV: RRR, no m/r/g, no peripheral edema GI: soft, non-tender, non-distended, no hepatosplenomegaly SKIN: warm and dry, no rashes or lesions. Excoriations appreciated on bilateral arms where patient has been scratching. NEURO: II-XII grossly intact PSYCH: AAOx3, appropriate  affect  Assessment and plan:  1. Itching and lip swelling - based on timing with itching starting shortly after she initiated new medications, it is possible that the itching is a reaction to one of the medications. The lip swelling started after she had been on the medications for a month, so this seems less likely to be a drug reaction. However, with an ACE we have to be cautious about angioedema. IT appears that she tolerated enalapril in the past, making angioedema less likely at this point. Patient is open to holding her ACE and statin and seeing if lip swelling improves.  - continue jiardiance - hold lisinopril - hold statin - follow up in 2-4 weeks to see if symptoms have improved. At that time would consider starting an ARB for renal protection given microalbuminuria - if she tolerates the ARB, would subsequently add back the statin   Howard Pouch, MD,MS,  PGY3 05/09/2018 11:03 AM

## 2018-05-05 ENCOUNTER — Ambulatory Visit: Payer: BLUE CROSS/BLUE SHIELD | Admitting: Podiatry

## 2018-05-05 ENCOUNTER — Encounter: Payer: Self-pay | Admitting: Podiatry

## 2018-05-05 ENCOUNTER — Ambulatory Visit: Payer: Self-pay

## 2018-05-05 DIAGNOSIS — M778 Other enthesopathies, not elsewhere classified: Secondary | ICD-10-CM

## 2018-05-05 DIAGNOSIS — M2010 Hallux valgus (acquired), unspecified foot: Secondary | ICD-10-CM | POA: Diagnosis not present

## 2018-05-05 DIAGNOSIS — M2142 Flat foot [pes planus] (acquired), left foot: Secondary | ICD-10-CM

## 2018-05-05 DIAGNOSIS — M722 Plantar fascial fibromatosis: Secondary | ICD-10-CM

## 2018-05-05 DIAGNOSIS — M2141 Flat foot [pes planus] (acquired), right foot: Secondary | ICD-10-CM

## 2018-05-05 DIAGNOSIS — M779 Enthesopathy, unspecified: Secondary | ICD-10-CM

## 2018-05-05 MED ORDER — MELOXICAM 15 MG PO TABS: 15.0000 mg | ORAL_TABLET | Freq: Every day | ORAL | 0 refills | Status: DC

## 2018-05-05 NOTE — Progress Notes (Signed)
This patient presents to the office with chief complaint of pain through the both feet.  She states that she has been dealing with this pain for over 1 year but the last few weeks the pain has become intense.  She says she works third shift and does significant standing on concrete floors.  She points to the bottom of both of her feet as the site of her pain. She says her pain is 4 out of 10. She states that the pain worsens or the more that she stands and walks at work.  She denies any history of trauma or injury to the foot.  His patient states that she is diabetic and has been diabetic for 25 years.  She has provided no self treatment nor sought any professional help.  She presents the office today for an evaluation and treatment of her painful feet.  Vascular  Dorsalis pedis and posterior tibial pulses are palpable  B/L.  Capillary return  WNL.  Temperature gradient is  WNL.  Skin turgor  WNL  Sensorium  Senn Weinstein monofilament wire  WNL. Normal tactile sensation.  Nail Exam  Patient has normal nails with no evidence of bacterial or fungal infection.  Orthopedic  Exam  Muscle tone and muscle strength  WNL.  No limitations of motion feet  B/L.  HAV 1st MPJ  B/L.  Flexible pes planus  B/L.  Skin  No open lesions.  Normal skin texture and turgor.  Plantar  Fasciitis B/L  HAV  B/L.  Flexible pes planus.    IE.   Discussed this condition with this patient.  Explained to this patient that her flexible planus has allowed her foot to become painful.  Prescribed Mobic to be taken by mouth daily.  Recommended that she wear her power step insor work shoes at to help to relieve the pain and allow her foot to function in a better position.  RTC 4 weeks.   Helane Gunther DPM

## 2018-05-30 ENCOUNTER — Other Ambulatory Visit: Payer: Self-pay | Admitting: Family Medicine

## 2018-05-30 DIAGNOSIS — E7800 Pure hypercholesterolemia, unspecified: Secondary | ICD-10-CM

## 2018-05-31 ENCOUNTER — Ambulatory Visit (INDEPENDENT_AMBULATORY_CARE_PROVIDER_SITE_OTHER): Payer: BLUE CROSS/BLUE SHIELD | Admitting: Family Medicine

## 2018-05-31 ENCOUNTER — Other Ambulatory Visit: Payer: Self-pay

## 2018-05-31 ENCOUNTER — Encounter: Payer: Self-pay | Admitting: Family Medicine

## 2018-05-31 VITALS — BP 124/78 | HR 92 | Temp 98.7°F | Ht 65.0 in | Wt 171.0 lb

## 2018-05-31 DIAGNOSIS — Z79899 Other long term (current) drug therapy: Secondary | ICD-10-CM | POA: Diagnosis not present

## 2018-05-31 DIAGNOSIS — R809 Proteinuria, unspecified: Secondary | ICD-10-CM

## 2018-05-31 DIAGNOSIS — E7800 Pure hypercholesterolemia, unspecified: Secondary | ICD-10-CM

## 2018-05-31 DIAGNOSIS — N898 Other specified noninflammatory disorders of vagina: Secondary | ICD-10-CM | POA: Insufficient documentation

## 2018-05-31 DIAGNOSIS — E119 Type 2 diabetes mellitus without complications: Secondary | ICD-10-CM | POA: Diagnosis not present

## 2018-05-31 DIAGNOSIS — Z780 Asymptomatic menopausal state: Secondary | ICD-10-CM | POA: Insufficient documentation

## 2018-05-31 DIAGNOSIS — N951 Menopausal and female climacteric states: Secondary | ICD-10-CM

## 2018-05-31 HISTORY — DX: Asymptomatic menopausal state: Z78.0

## 2018-05-31 LAB — POCT GLYCOSYLATED HEMOGLOBIN (HGB A1C): HbA1c, POC (controlled diabetic range): 8.6 % — AB (ref 0.0–7.0)

## 2018-05-31 LAB — POCT WET PREP (WET MOUNT)
CLUE CELLS WET PREP WHIFF POC: POSITIVE
Trichomonas Wet Prep HPF POC: ABSENT

## 2018-05-31 MED ORDER — EMPAGLIFLOZIN 25 MG PO TABS: 25.0000 mg | ORAL_TABLET | Freq: Every day | ORAL | 3 refills | Status: DC

## 2018-05-31 NOTE — Patient Instructions (Addendum)
Please restart your atorvastatin, one tablet a day, to lower your cholesterol.  Return for a lab visit in 6 to 12 weeks to have your cholesterol rechecked.   Please change your Jardiance to two tablets once a day unitl you use up your supply of Jardiance 10 mg tablets.  Start taking Jardiance 25 mg tablet after you use up all your Jardiance 10 mg tablets.  We are increasing the dose of your Jardiance to improve your blood sugars even more.    Your vaginal dryness is likely from the thinning of the skin of the vagina after menopause.  Try the over-the-counter vaginal skin moisturizer, Replans.  If it does not help with the itching, you may want to consider using an estrogen cream or suppository that is placed in the vagina.  The estrogen is not absorbed into the blood.  It will "plump" up the thin post-menopausal vaginal skin which helps stop the itching.  Doctors call this thinning of the post-menopausal skin "Atrophic Vaginitis."  Dr McDiarmid believes that you have had some injury to your superficial peroneal nerve around your right knee.  It is the "funny bone" nerve of the leg.   It often will get better over months.  If you start dragging your right foot when walking, please let our office know.   Dr McDiarmid suspects that the tenderness in your right knee may be early signs of osteoarthritis of the knee.  Your weight loss is the best way to prevent the arthritis from getting worse.   We are checking your kidneys and electrolytes today, along with seeing if your urine protein is still present.

## 2018-05-31 NOTE — Progress Notes (Signed)
   Subjective:    Patient ID: Marisa Davis, female    DOB: 1971-04-23, 47 y.o.   MRN: [id number] Marisa Davis is alone Sources of clinical information for visit is/are patient and past medical records. Nursing assessment for this office visit was reviewed with the patient for accuracy and revision.  Previous Report(s) Reviewed: historical medical records  Depression screen [facility] 2/9 05/04/2018  Decreased Interest 0  Down, Depressed, Hopeless 0  PHQ - 2 Score 0   Fall Risk  09/29/2017 12/06/2016  Falls in the past year? No No    HPI  Problem List Items Addressed This Visit      Unprioritized   HLD (hyperlipidemia) HYPERLIPIDEMIA  Disease Monitoring: No chest pain, no claudication, no limb or face weakness  Medications: Compliance- not taking atorvastatin bc of angioedema with combo lisinopril, atorvastatin and jardiance     Relevant Medications   atorvastatin (LIPITOR) 20 MG tablet   Other Relevant Orders   LDL Cholesterol, Direct   DM2 (diabetes mellitus, type 2) (HCC) - Primary - Disease Monitoring  Blood Sugar Ranges: not checking at home  Polyuria: no   Visual problems: no   Medication Compliance: yes, taking Jardiance 10 mg daily, Glipizide 2.5 mg daily, metforin 1000 mg BID   Medication Side Effects  Hypoglycemia: no      Other Relevant Orders   HgB A1c (Completed)   Basic Metabolic Panel   Microalbumin / creatinine urine ratio   LDL Cholesterol, Direct   Post-menopause - Last menses over 12 months ago.  Her mother had early menopause.  She has had C-section and Leep procedure in past. - Hot Flashes Onset: within last year Course: stable Pattern: intermittent Self care: nothing Limitations in usual activities: none.  Occurs in sleep occassionally    Itching in the vaginal area - History elements: Onset / Duration: several months ago Quality: itching Location: skin of vulva Severity: moderate Timing: intermittent Course: chronic Treatment prior  to visit: nothing Context: 12 months without menses No vaginal bleeding   Alleviating factors nothing  Aggravating nothing      Other Visit Diagnoses    High risk medication use       Relevant Orders   Basic Metabolic Panel   Vaginal irritation       Relevant Orders   POCT Wet Prep Mellody [place]) (Completed)     SH: smoking status reviewed  Review of Systems See hpi    Objective:   Physical Exam VS reviewed GEN: Alert, Cooperative, Groomed, NAD HEENT: PERRL; EAC bilaterally not occluded, TM's translucent with normal LM, (+) LR;                No cervical LAN, No thyromegaly, No palpable masses COR: RRR, No M/G/R, No JVD, Normal PMI size and location LUNGS: BCTA, No Acc mm use, speaking in full sentences ABDOMEN: (+)BS, soft, NT, ND, No HSM, No palpable masses Gait: Normal speed, No significant path deviation, Step through +,  Psych: Normal affect/thought/speech/language   Assessment & Plan:  Visit Problem List with A/P  No problem-specific Assessment & Plan notes found for this encounter.

## 2018-06-01 ENCOUNTER — Encounter: Payer: Self-pay | Admitting: Family Medicine

## 2018-06-01 LAB — BASIC METABOLIC PANEL
BUN / CREAT RATIO: 29 — AB (ref 9–23)
BUN: 20 mg/dL (ref 6–24)
CO2: 22 mmol/L (ref 20–29)
CREATININE: 0.68 mg/dL (ref 0.57–1.00)
Calcium: 9.7 mg/dL (ref 8.7–10.2)
Chloride: 103 mmol/L (ref 96–106)
GFR, EST AFRICAN AMERICAN: 120 mL/min/{1.73_m2} (ref >59)
GFR, EST NON AFRICAN AMERICAN: 104 mL/min/{1.73_m2} (ref >59)
Glucose: 93 mg/dL (ref 65–99)
Potassium: 3.9 mmol/L (ref 3.5–5.2)
Sodium: 140 mmol/L (ref 134–144)

## 2018-06-01 LAB — MICROALBUMIN / CREATININE URINE RATIO
Creatinine, Urine: 123.8 mg/dL
Microalb/Creat Ratio: 77.9 mg/g creat — ABNORMAL HIGH (ref 0.0–30.0)
Microalbumin, Urine: 96.5 ug/mL

## 2018-06-01 MED ORDER — ATORVASTATIN CALCIUM 20 MG PO TABS: 20.0000 mg | ORAL_TABLET | Freq: Every day | ORAL | 3 refills | Status: DC

## 2018-06-01 NOTE — Assessment & Plan Note (Signed)
Suspect this is atrophic vaginal mucosal changes post-menopause state.  While evidence of BV on self-wet prep- pt without complaint of odor or discharge, and BV is not usually an irritant/inflammatory vaginitis that would cause itching.   Recommend Replens for now, with discussion of topical estogen replacement options with Dr Pollie Meyer when she returns.

## 2018-06-01 NOTE — Assessment & Plan Note (Addendum)
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 SCr 0.7 Persistent microalbuminuria x 2 in 8 months Start low dose NDHP CCB, Diltazem 120 long-acting daily Watch for dizziness, lightheadedness

## 2018-06-01 NOTE — Assessment & Plan Note (Signed)
New diagnosis 12 months without menses.  Hx C-section and early maternal menopause. Vasomotor symptoms and vaginal dryness complaint. Spent time talking about options.  Patient wants to bring this issue up to Dr Pollie Meyer when she sees her next.

## 2018-06-01 NOTE — Assessment & Plan Note (Signed)
Lab Results  Component Value Date   HGBA1C 8.6 (A) 05/31/2018  Established problem that has improved.  Increase Jardiance to 25 mg daily Continue other meds

## 2018-06-01 NOTE — Assessment & Plan Note (Addendum)
Established problem Controlled Recommended pt restart atorvastatin 20 mg daily.

## 2018-06-01 NOTE — Assessment & Plan Note (Signed)
See postmenopausal problem

## 2018-06-04 ENCOUNTER — Telehealth: Payer: Self-pay | Admitting: Family Medicine

## 2018-06-04 DIAGNOSIS — N760 Acute vaginitis: Principal | ICD-10-CM

## 2018-06-04 DIAGNOSIS — R809 Proteinuria, unspecified: Secondary | ICD-10-CM

## 2018-06-04 DIAGNOSIS — B9689 Other specified bacterial agents as the cause of diseases classified elsewhere: Secondary | ICD-10-CM

## 2018-06-04 NOTE — Telephone Encounter (Signed)
Pt states that she is returning call to MD.  I see where a phone note was created but no message.  WIll forward to MD. Fleeger, Maryjo Rochester, CMA

## 2018-06-05 ENCOUNTER — Encounter: Payer: Self-pay | Admitting: Family Medicine

## 2018-06-05 MED ORDER — METRONIDAZOLE 500 MG PO TABS: 500.0000 mg | ORAL_TABLET | Freq: Two times a day (BID) | ORAL | 0 refills | Status: AC

## 2018-06-05 MED ORDER — DILTIAZEM HCL ER 120 MG PO CP24: 120.0000 mg | ORAL_CAPSULE | Freq: Every day | ORAL | 5 refills | Status: DC

## 2018-06-05 NOTE — Telephone Encounter (Signed)
I spoke with Marisa Davis about the persistence of microalbuminuria and the evidence of bacterial vaginosis.  A/ 1. Persistent moderately increased albuminuria 2. ADE to Lisinopril 3. Vulva itching - possible atrophic vaginitis 4. 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  Flagyl 500 mg BID x 7d

## 2018-06-08 ENCOUNTER — Encounter: Payer: Self-pay | Admitting: Podiatry

## 2018-06-08 ENCOUNTER — Ambulatory Visit: Payer: BLUE CROSS/BLUE SHIELD | Admitting: Podiatry

## 2018-06-08 DIAGNOSIS — M2141 Flat foot [pes planus] (acquired), right foot: Secondary | ICD-10-CM | POA: Diagnosis not present

## 2018-06-08 DIAGNOSIS — M779 Enthesopathy, unspecified: Secondary | ICD-10-CM

## 2018-06-08 DIAGNOSIS — M2010 Hallux valgus (acquired), unspecified foot: Secondary | ICD-10-CM

## 2018-06-08 DIAGNOSIS — M722 Plantar fascial fibromatosis: Secondary | ICD-10-CM | POA: Diagnosis not present

## 2018-06-08 DIAGNOSIS — M778 Other enthesopathies, not elsewhere classified: Secondary | ICD-10-CM

## 2018-06-08 DIAGNOSIS — M2142 Flat foot [pes planus] (acquired), left foot: Secondary | ICD-10-CM

## 2018-06-08 NOTE — Progress Notes (Signed)
This patient returns to the office follow-up for diagnosis of plantar fasciitis both feet.  She also was diagnosed with HAV first MPJ bilaterally.  She was treated with power step insoles and prescribed Mobic to be taken orally.  She presents the office today stating that she is on 100% improved and she is very pleased.  She says she has stopped taking Mobic a few days ago and has remained pain-free.  She presents the office today for continued evaluation and treatment of her feet.  Vascular  Dorsalis pedis and posterior tibial pulses are palpable  B/L.  Capillary return  WNL.  Temperature gradient is  WNL.  Skin turgor  WNL  Sensorium  Senn Weinstein monofilament wire  WNL. Normal tactile sensation.  Nail Exam  Patient has normal nails with no evidence of bacterial or fungal infection.  Orthopedic  Exam  Muscle tone and muscle strength  WNL.  No limitations of motion feet  B/L.  No crepitus or joint effusion noted.  Foot type is unremarkable and digits show no abnormalities.  HAV  B/L.  Pes planus  B/L.  He is  Skin  No open lesions.  Normal skin texture and turgor.  S/P plantar fasciitis  ROV.  Told this patient to continue wearing her power step insoles.  She was told she could discontinue the Mobic use but to keep the Mobic around in case she has a painful episode.  Finally told her that we could acquire permanent orthoses for her foot care in the future.  Return to the clinic as needed.  Helane Gunther DPM

## 2018-06-28 DIAGNOSIS — Z1239 Encounter for other screening for malignant neoplasm of breast: Secondary | ICD-10-CM | POA: Diagnosis not present

## 2018-06-28 DIAGNOSIS — Z1231 Encounter for screening mammogram for malignant neoplasm of breast: Secondary | ICD-10-CM | POA: Diagnosis not present

## 2018-07-04 ENCOUNTER — Ambulatory Visit: Payer: BLUE CROSS/BLUE SHIELD | Admitting: Family Medicine

## 2018-07-04 ENCOUNTER — Other Ambulatory Visit (HOSPITAL_COMMUNITY)
Admission: RE | Admit: 2018-07-04 | Discharge: 2018-07-04 | Disposition: A | Discharge status: Home / Self Care | Payer: BLUE CROSS/BLUE SHIELD | Source: Ambulatory Visit | Attending: Family Medicine | Admitting: Family Medicine

## 2018-07-04 ENCOUNTER — Other Ambulatory Visit: Payer: Self-pay

## 2018-07-04 VITALS — BP 110/60 | HR 97 | Temp 98.4°F | Wt 170.0 lb

## 2018-07-04 DIAGNOSIS — N898 Other specified noninflammatory disorders of vagina: Secondary | ICD-10-CM | POA: Diagnosis not present

## 2018-07-04 DIAGNOSIS — B373 Candidiasis of vulva and vagina: Secondary | ICD-10-CM | POA: Insufficient documentation

## 2018-07-04 DIAGNOSIS — N889 Noninflammatory disorder of cervix uteri, unspecified: Secondary | ICD-10-CM | POA: Diagnosis not present

## 2018-07-04 DIAGNOSIS — B3731 Acute candidiasis of vulva and vagina: Secondary | ICD-10-CM

## 2018-07-04 LAB — POCT WET PREP (WET MOUNT)
Clue Cells Wet Prep Whiff POC: NEGATIVE
Trichomonas Wet Prep HPF POC: ABSENT

## 2018-07-04 MED ORDER — FLUCONAZOLE 150 MG PO TABS: 150.0000 mg | ORAL_TABLET | Freq: Once | ORAL | 0 refills | Status: AC

## 2018-07-04 NOTE — Assessment & Plan Note (Addendum)
Seen on exam today with concerning appearance however had normal pap with neg HPV in May 2018. Maybe cervicitis due to vaginal yeast infection. Discussed return to colpo clinic for revisualization after adequately treated for vaginal yeast infection. Appt made for 08/16/18

## 2018-07-04 NOTE — Progress Notes (Signed)
    Subjective:  Marisa Davis is a 47 y.o. female who [facility] today with a chief complaint of vaginal discharge.   HPI:  Vaginal discharge since Saturday, whitish. Very itchy and burning. No foul odor, slight odor.  No concern for STD exposure, is sexually active. No urinary symptoms.    ROS: Per HPI   Objective:  Physical Exam: BP 110/60   Pulse 97   Temp 98.4 F (36.9 C) (Oral)   Wt 170 lb (77.1 kg)   LMP 09/29/2014   SpO2 97%   BMI 28.29 kg/m   Gen: NAD, resting comfortably Pelvic exam: VULVA: with erythema and irritation. Vagina with thick clumpy white discharge. CERVIX: lesion present at 10 o'clock about 1 cm in diameter, flat whitish nonscrapable with irregular borders and on medial side of border with erythema Neuro: grossly normal, moves all extremities Psych: Normal affect and thought content  Results for orders placed or performed in visit on 07/04/18 (from the past 72 hour(s))  POCT Wet Prep Mellody Drown([place]et [place])     Status: Abnormal   Collection Time: 07/04/18  9:20 AM  Result Value Ref Range   Source Wet Prep POC VAG    WBC, Wet Prep HPF POC 0-3    Bacteria Wet Prep HPF POC Moderate (A) Few   Clue Cells Wet Prep HPF POC None None   Clue Cells Wet Prep Whiff POC Negative Whiff    Yeast Wet Prep HPF POC Few (A) None   Trichomonas Wet Prep HPF POC Absent Absent     Assessment/Plan:  Vaginal yeast infection Treat with diflucan. Discussed can use OTC yeast infection cream for external irritation  Lesion of cervix /Seens on exam today with concerning appearance however had normal pap with neg HPV in May 2018. Maybe cervicitis due to vaginal yeast infection. Discussed return to colpo clinic for revisualization after adequately treated for vaginal yeast infection. Appt made for 08/16/18   Leland HerElsia J Earle Troiano, DO PGY-3, [facility] 07/04/2018 9:13 AM

## 2018-07-04 NOTE — Assessment & Plan Note (Signed)
Treat with diflucan. Discussed can use OTC yeast infection cream for external irritation

## 2018-07-04 NOTE — Patient Instructions (Addendum)
Come back in 1 month to have your cervix looked at again.   1 tablet of diflucan for your vaginal yeast infection  We are checking some labs today. If results require attention, either myself or my nurse will get in touch with you. If everything is normal, you will get a letter in the mail or a message in My Chart. Please give us a call if you do not hear from us after 2 weeks.    Vaginal Yeast infection, Adult Vaginal yeast infection is a condition that causes soreness, swelling, and redness (inflammation) of the vagina. It also causes vaginal discharge. This is a common condition. Some women get this infection frequently. What are the causes? This condition is caused by a change in the normal balance of the yeast (candida) and bacteria that live in the vagina. This change causes an overgrowth of yeast, which causes the inflammation. What increases the risk? This condition is more likely to develop in:  Women who take antibiotic medicines.  Women who have diabetes.  Women who take birth control pills.  Women who are pregnant.  Women who douche often.  Women who have a weak defense (immune) system.  Women who have been taking steroid medicines for a long time.  Women who frequently wear tight clothing.  What are the signs or symptoms? Symptoms of this condition include:  White, thick vaginal discharge.  Swelling, itching, redness, and irritation of the vagina. The lips of the vagina (vulva) may be affected as well.  Pain or a burning feeling while urinating.  Pain during sex.  How is this diagnosed? This condition is diagnosed with a medical history and physical exam. This will include a pelvic exam. Your health care provider will examine a sample of your vaginal discharge under a microscope. Your health care provider may send this sample for testing to confirm the diagnosis. How is this treated? This condition is treated with medicine. Medicines may be over-the-counter  or prescription. You may be told to use one or more of the following:  Medicine that is taken orally.  Medicine that is applied as a cream.  Medicine that is inserted directly into the vagina (suppository).  Follow these instructions at home:  Take or apply over-the-counter and prescription medicines only as told by your health care provider.  Do not have sex until your health care provider has approved. Tell your sex partner that you have a yeast infection. That person should go to his or her health care provider if he or she develops symptoms.  Do not wear tight clothes, such as pantyhose or tight pants.  Avoid using tampons until your health care provider approves.  Eat more yogurt. This may help to keep your yeast infection from returning.  Try taking a sitz bath to help with discomfort. This is a warm water bath that is taken while you are sitting down. The water should only come up to your hips and should cover your buttocks. Do this 3-4 times per day or as told by your health care provider.  Do not douche.  Wear breathable, cotton underwear.  If you have diabetes, keep your blood sugar levels under control. Contact a health care provider if:  You have a fever.  Your symptoms go away and then return.  Your symptoms do not get better with treatment.  Your symptoms get worse.  You have new symptoms.  You develop blisters in or around your vagina.  You have blood coming from your vagina  and it is not your menstrual period.  You develop pain in your abdomen. This information is not intended to replace advice given to you by your health care provider. Make sure you discuss any questions you have with your health care provider. Document Released: 05/04/2005 Document Revised: 01/06/2016 Document Reviewed: 01/26/2015 Elsevier Interactive Patient Education  2018 [employer].

## 2018-07-06 LAB — CERVICOVAGINAL ANCILLARY ONLY
Chlamydia: NEGATIVE
NEISSERIA GONORRHEA: NEGATIVE

## 2018-07-09 ENCOUNTER — Encounter: Payer: Self-pay | Admitting: Family Medicine

## 2018-08-16 ENCOUNTER — Other Ambulatory Visit: Payer: Self-pay

## 2018-08-16 ENCOUNTER — Ambulatory Visit: Payer: BLUE CROSS/BLUE SHIELD | Admitting: Family Medicine

## 2018-08-16 DIAGNOSIS — N889 Noninflammatory disorder of cervix uteri, unspecified: Secondary | ICD-10-CM

## 2018-08-16 NOTE — Patient Instructions (Signed)
Your colposcope exam was totally normal. Very reassuring. Your next pap smear should be in 2023. Have a great day!

## 2018-08-16 NOTE — Progress Notes (Signed)
   Subjective:    Marisa Davis - 48 y.o. female MRN [id number]  Date of birth: September 16, 1970  CC: Cervical lesion  HPI: Marisa Davis was referred to colposcopy clinic after a cervical lesion was visualized on a previous office visit.  Most recent Pap smear was in May 2018 and was negative for atypical cells and HPV.  Patient reports no other concerns today.   Health Maintenance:  Health Maintenance Due  Topic Date Due  . PNEUMOCOCCAL POLYSACCHARIDE VACCINE AGE 39-64 HIGH RISK  05/04/1972  . OPHTHALMOLOGY EXAM  11/20/2015    -  reports that she has never smoked. She has never used smokeless tobacco. - Review of Systems: Per HPI. - Past Medical History: Patient Active Problem List   Diagnosis Date Noted  . Lesion of cervix 07/04/2018  . Vaginal yeast infection 07/04/2018  . Post-menopause 05/31/2018  . Menopausal vasomotor syndrome 05/31/2018  . Onychomycosis 12/08/2016  . Esophageal reflux 04/01/2016  . DM2 (diabetes mellitus, type 2) (HCC) 10/07/2014  . Hyperpigmentation of skin of cheek 12/03/2012  . Moderately increased albuminuria 12/09/2011  . ANXIETY DEPRESSION 10/17/2007  . HLD (hyperlipidemia) 10/05/2006  . OBESITY, NOS 10/05/2006   - Medications: reviewed and updated   Objective:   Physical Exam BP 118/70   Pulse 89   Temp 98.4 F (36.9 C) (Oral)   Wt 166 lb (75.3 kg)   LMP 09/29/2014   SpO2 (!) 9%   BMI 27.62 kg/m  Gen: NAD, alert, cooperative with exam, well-appearing GU: Normal vulva, vagina, cervix with a white area outside of the squamocolumnar junction at the 11 o'clock position, visualized before acetic acid was added  Patient given informed consent, signed copy in the chart.  Placed in lithotomy position. Cervix viewed with speculum and colposcope after application of acetic acid.   Colposcopy adequate (entire squamocolumnar junctions seen  in entirety) ?  Yes Acetowhite lesions?  No Punctation?  No Mosaicism?  No Abnormal vasculature?   No Biopsies?  None ECC?  No Complications?  No, patient tolerated procedure well        Assessment & Plan:   Normal appearing cervix without concerning lesions.  Patient was informed and advised to get her next Pap smear with HPV cotesting in May 2023 as per screening recommendations.  Lezlie Octave, M.D. 08/16/2018, 8:59 AM PGY-2, [facility]

## 2018-08-30 ENCOUNTER — Encounter: Payer: Self-pay | Admitting: Family Medicine

## 2018-09-12 ENCOUNTER — Other Ambulatory Visit: Payer: Self-pay | Admitting: Family Medicine

## 2018-09-12 DIAGNOSIS — R809 Proteinuria, unspecified: Secondary | ICD-10-CM

## 2018-09-12 NOTE — Telephone Encounter (Signed)
Pt came in office to request refill of:  Name of Medication(s):  Metformin and glipizide Last date of OV:  08/16/18 Pharmacy:  [facility] [address]  Will route refill request to Clinic RN.  Discussed with patient policy to call pharmacy for future refills.  Also, discussed refills may take up to 48 hours to approve or deny.  Herma Mering Mag[place]oto  Best Contact # [phone]

## 2018-09-14 ENCOUNTER — Telehealth: Payer: Self-pay

## 2018-09-14 MED ORDER — METFORMIN HCL 1000 MG PO TABS: 1000.0000 mg | ORAL_TABLET | Freq: Two times a day (BID) | ORAL | 0 refills | Status: DC

## 2018-09-14 MED ORDER — DILTIAZEM HCL ER 120 MG PO CP24: 120.0000 mg | ORAL_CAPSULE | Freq: Every day | ORAL | 1 refills | Status: DC

## 2018-09-14 MED ORDER — GLIPIZIDE ER 2.5 MG PO TB24: 2.5000 mg | ORAL_TABLET | Freq: Every day | ORAL | 0 refills | Status: DC

## 2018-09-14 NOTE — Telephone Encounter (Signed)
Will refill, patient is due for follow up appointment with me. Please have her schedule. Thanks! Latrelle Dodrill, MD

## 2018-09-14 NOTE — Addendum Note (Signed)
Addended by: Gilberto Better R on: 09/14/2018 11:07 AM   Modules accepted: Orders

## 2018-09-14 NOTE — Telephone Encounter (Signed)
Called and informed patient of RX's. Made follow up appointment and patient states that she also needs refill for Dilacor. Was ordered.  Glennie Hawk, CMA

## 2018-09-18 ENCOUNTER — Ambulatory Visit (INDEPENDENT_AMBULATORY_CARE_PROVIDER_SITE_OTHER): Payer: BLUE CROSS/BLUE SHIELD | Admitting: Family Medicine

## 2018-09-18 VITALS — BP 130/80 | HR 106 | Temp 98.8°F | Wt 164.6 lb

## 2018-09-18 DIAGNOSIS — E119 Type 2 diabetes mellitus without complications: Secondary | ICD-10-CM

## 2018-09-18 DIAGNOSIS — Z780 Asymptomatic menopausal state: Secondary | ICD-10-CM | POA: Diagnosis not present

## 2018-09-18 LAB — POCT GLYCOSYLATED HEMOGLOBIN (HGB A1C): HBA1C, POC (CONTROLLED DIABETIC RANGE): 7.8 % — AB (ref 0.0–7.0)

## 2018-09-18 MED ORDER — CETIRIZINE HCL 10 MG PO TABS: 10.0000 mg | ORAL_TABLET | Freq: Every day | ORAL | 3 refills | Status: DC

## 2018-09-18 NOTE — Patient Instructions (Signed)
Use moisturizing lubrication in your vagina. If that does not help let me know and I'll send in the prescription cream.  Start zyrtec 10mg  daily Stay on jardiance Call if swelling getting worse  Get your eye exam  Follow up in 3 months  Be well, Dr. Pollie Meyer

## 2018-09-18 NOTE — Progress Notes (Addendum)
Subjective:   HPI:  [name] is a 48yo F who presents for follow up.  Swelling - intermittent swelling of toes, arms, face. She reports this began around [place] of 2019. The swelling occurs 1-2x/month and is followed by itching and redness that reduces over one day. Her facial swelling is around her cheeks, mouth but not her lips/tongue/throat. Last night she had swelling in her L antecubital fossa that has reduced in size but is still red. Her BL toes are slightly swollen today as well. She was taken off of Lisinopril Oct 2019 after some angioedema of lips. She feels the swelling could be from [place] as she started that in July 2019. She otherwise likes jardiance and wants to stay on it if possible.  Diabetes - taking glipizide XL 2.5mg  daily, metformin 1000mg  twice daily, and jardiance 7195m gdaily. Does not check sugars. No symptoms of low sugars. Has equipment to check sugar sif she needed to. No chest pain or shortness of breath.   Marisa Davis also reports some vaginal dryness. She is post-menopausal and has been noticing some slight discomfort.  She does not want the Pneumonia vaccine today as she states she has not gotten sick yet without receiving the pneumonia/flu vaccines.     Objective:     BP 130/80   Pulse (!) 106   Temp 98.8 F (37.1 C)   Wt 164 lb 9.6 oz (74.7 kg)   LMP 09/29/2014   SpO2 99%   BMI 27.39 kg/m   Physical Exam Constitutional:      General: She is not in acute distress.    Appearance: Normal appearance.  Cardiovascular:     Rate and Rhythm: Normal rate and regular rhythm.     Heart sounds: Normal heart sounds.  Pulmonary:     Effort: Pulmonary effort is normal.     Breath sounds: Normal breath sounds.  Abdominal:     General: There is no distension.     Palpations: Abdomen is soft.     Tenderness: There is no abdominal tenderness.  Musculoskeletal:        General: Swelling present.     Comments: BL toe swelling + blanching  erythema of all digits, L antecubital fossa erythema (w/o swelling)  Neurological:     Mental Status: She is alert.             Assessment & Plan:   Health Maintenance Pt initially in precontemplative phase w/r/t immunizations, though had positive response to PNA23 shot only being necessary once until age 48 -patient prefers to discuss PNA polysaccharide next visit -advised to get eye exam  DM2 (diabetes mellitus, type 2) (HCC) Presents with intermittent swelling of her toes, arms potentially caused by drug reaction. The timing of swelling beginning 03/2018 after starting Jardiance 03/02/18 is suspicious. Med has been working well for her as A1c decreased from 10.5 (7months ago) to 7.8 today. Discussed alternative medications, but given improvement on Jardiance Pt wants to remain on medication. Will add zyrtec 10mg  daily as antihistamine to prevent reaction. If no improvement or worsening could consider trial off jardiance. Used shared decision making with patient to come up with this plan. She will let us know if it worsens.  Post-menopause Vaginal Discomfort -Start trial of OTC lubricants  -Follow up to see if topical estrogen creams will be necessary     Return in about 3 months (around 12/17/2018).  Barbara Cowerhamara J Dharmasri, Medical Student  Patient seen along with MS3 student Aquilla Solianhamara  Dharmasri. I personally evaluated this patient along with the student, and verified all aspects of the history, physical exam, and medical decision making as documented by the student. I agree with the student's documentation and have made all necessary edits.  Levert FeinsteinBrittany Magnum Lunde, MD [facility]

## 2018-09-19 NOTE — Assessment & Plan Note (Signed)
Vaginal Discomfort -Start trial of OTC lubricants  -Follow up to see if topical estrogen creams will be necessary

## 2018-09-19 NOTE — Assessment & Plan Note (Signed)
Presents with intermittent swelling of her toes, arms potentially caused by drug reaction. The timing of swelling beginning 03/2018 after starting Jardiance 03/02/18 is suspicious. Med has been working well for her as A1c decreased from 10.5 (35months ago) to 7.8 today. Discussed alternative medications, but given improvement on Jardiance Pt wants to remain on medication. Will add zyrtec 10mg  daily as antihistamine to prevent reaction. If no improvement or worsening could consider trial off jardiance. Used shared decision making with patient to come up with this plan. She will let us know if it worsens.

## 2018-12-02 ENCOUNTER — Other Ambulatory Visit: Payer: Self-pay | Admitting: Family Medicine

## 2018-12-06 ENCOUNTER — Other Ambulatory Visit: Payer: Self-pay | Admitting: Family Medicine

## 2018-12-17 ENCOUNTER — Telehealth: Payer: BLUE CROSS/BLUE SHIELD | Admitting: Family Medicine

## 2018-12-17 ENCOUNTER — Telehealth: Payer: Self-pay | Admitting: Family Medicine

## 2018-12-17 ENCOUNTER — Other Ambulatory Visit: Payer: Self-pay

## 2018-12-17 NOTE — Telephone Encounter (Signed)
Patient originally had virtual office visit scheduled with me this AM, but my schedule changed and staff were unable to reach patient to reschedule the appointment. I called her just now and left voicemail offering to do the appointment now, or for her to call and reschedule to another time if she would like.  Latrelle Dodrill, MD

## 2019-01-18 ENCOUNTER — Other Ambulatory Visit: Payer: Self-pay

## 2019-01-18 MED ORDER — EMPAGLIFLOZIN 25 MG PO TABS: 25.0000 mg | ORAL_TABLET | Freq: Every day | ORAL | 0 refills | Status: DC

## 2019-03-02 ENCOUNTER — Other Ambulatory Visit: Payer: Self-pay | Admitting: Family Medicine

## 2019-03-07 ENCOUNTER — Telehealth: Payer: Self-pay

## 2019-03-07 NOTE — Telephone Encounter (Signed)
Please have patient schedule follow up appointment with me for diabetes Thanks Leeanne Rio, MD

## 2019-03-07 NOTE — Telephone Encounter (Signed)
Informed patient to call [facility] and make an appointment for follow up on diabetes.  Marisa Davis, [place]

## 2019-03-29 ENCOUNTER — Encounter: Payer: Self-pay | Admitting: Family Medicine

## 2019-03-29 ENCOUNTER — Ambulatory Visit (INDEPENDENT_AMBULATORY_CARE_PROVIDER_SITE_OTHER): Payer: BC Managed Care – PPO | Admitting: Family Medicine

## 2019-03-29 ENCOUNTER — Other Ambulatory Visit: Payer: Self-pay

## 2019-03-29 VITALS — BP 122/80 | HR 72 | Wt 162.2 lb

## 2019-03-29 DIAGNOSIS — E119 Type 2 diabetes mellitus without complications: Secondary | ICD-10-CM | POA: Diagnosis not present

## 2019-03-29 DIAGNOSIS — R809 Proteinuria, unspecified: Secondary | ICD-10-CM | POA: Diagnosis not present

## 2019-03-29 DIAGNOSIS — E7800 Pure hypercholesterolemia, unspecified: Secondary | ICD-10-CM | POA: Diagnosis not present

## 2019-03-29 DIAGNOSIS — E118 Type 2 diabetes mellitus with unspecified complications: Secondary | ICD-10-CM

## 2019-03-29 LAB — POCT GLYCOSYLATED HEMOGLOBIN (HGB A1C): HbA1c, POC (controlled diabetic range): 6.5 % (ref 0.0–7.0)

## 2019-03-29 NOTE — Assessment & Plan Note (Signed)
Advised to restart diltiazem (patient has at home, had just stopped taking for no specific reason). Check urine microalbumin next time she comes.

## 2019-03-29 NOTE — Assessment & Plan Note (Signed)
Check lipids today. Advised restarting atorvastatin given known diabetes.

## 2019-03-29 NOTE — Progress Notes (Signed)
Date of Visit: 03/29/2019   HPI:  Patient presents for routine follow up  Diabetes - currently taking glipizide XL 2.5mg  daily, metformin 1000mg  twice daily, and jardiance 25mg  daily. Feels well overall. No concerns about this medication regimen. Last eye exam >1 year ago - patient will schedule. No chest pain or shortness of breath.   Microalbuminuria - previously started on diltiazem which patient is not currently taking, no specific reason for this. Agreeable to restarting. History of angioedema with ACE inhibitor in the past.  Hyperlipidemia - not taking atorvastatin, again no specific reason. She is fasting today.  Has noticed some itching between her 3rd/4th/5th toes on the R side.  ROS: See HPI.  [place]: history of microalbuminuria, type 2 diabetes, GERD, hyperlipidemia, obesity  PHYSICAL EXAM: BP 122/80   Pulse 72   Wt 162 lb 3.2 oz (73.6 kg)   LMP 09/29/2014   SpO2 99%   BMI 26.99 kg/m  Gen: no acute distress, pleasant, cooperative HEENT: normocephalic, atraumatic  Heart: regular rate and rhythm, no murmur Lungs: clear to auscultation bilaterally, normal work of breathing  Neuro: alert, grossly nonfocal, speech normal Ext: Diabetic foot exam: 2+ DP pulses bilat, normal monofilament testing bilaterally. No lesions or significant calluses except tinea pedis (white material) present between R 3rd/4th/5th toes  ASSESSMENT/PLAN:  Health maintenance:  -patient declines pneumonia vaccination today -advised to schedule eye exam -foot exam done today  Tinea pedis Start clotrimazole over the counter twice daily for 4 weeks  Moderately increased albuminuria Advised to restart diltiazem (patient has at home, had just stopped taking for no specific reason). Check urine microalbumin next time she comes.  DM2 (diabetes mellitus, type 2) ([place]) Excellent control with A1c of 6.5. congratulated patient. Continue current regimen. Follow up in 6 months.   HLD  (hyperlipidemia) Check lipids today. Advised restarting atorvastatin given known diabetes.   FOLLOW UP: Follow up in 6 months for above issues  Marisa Davis, [place]

## 2019-03-29 NOTE — Assessment & Plan Note (Signed)
Excellent control with A1c of 6.5. congratulated patient. Continue current regimen. Follow up in 6 months.

## 2019-03-29 NOTE — Patient Instructions (Addendum)
It was great to see you again today!  A1c is awesome today. Keep up the great work.  Restart atorvastatin (lipitor) and diltiazem. Pick one of these this week to restart, add the other one next week.  Get some over the counter clotrimazole cream and apply between toes twice daily for 4 weeks  Call with any questions or concerns. Follow up in 6 months.  Go get your eye exam and have them send [place] the records.  Be well, Dr. Ardelia Mems

## 2019-03-30 LAB — CMP14+EGFR
ALT: 20 IU/L (ref 0–32)
AST: 18 IU/L (ref 0–40)
Albumin/Globulin Ratio: 1.5 (ref 1.2–2.2)
Albumin: 4.3 g/dL (ref 3.8–4.8)
Alkaline Phosphatase: 101 IU/L (ref 39–117)
BUN/Creatinine Ratio: 22 (ref 9–23)
BUN: 16 mg/dL (ref 6–24)
Bilirubin Total: 0.4 mg/dL (ref 0.0–1.2)
CO2: 24 mmol/L (ref 20–29)
Calcium: 9.4 mg/dL (ref 8.7–10.2)
Chloride: 103 mmol/L (ref 96–106)
Creatinine, Ser: 0.72 mg/dL (ref 0.57–1.00)
GFR calc Af Amer: 115 mL/min/{1.73_m2} (ref >59)
GFR calc non Af Amer: 99 mL/min/{1.73_m2} (ref >59)
Globulin, Total: 2.9 g/dL (ref 1.5–4.5)
Glucose: 85 mg/dL (ref 65–99)
Potassium: 4.4 mmol/L (ref 3.5–5.2)
Sodium: 141 mmol/L (ref 134–144)
Total Protein: 7.2 g/dL (ref 6.0–8.5)

## 2019-03-30 LAB — LIPID PANEL
Chol/HDL Ratio: 3.7 ratio (ref 0.0–4.4)
Cholesterol, Total: 167 mg/dL (ref 100–199)
HDL: 45 mg/dL (ref >39)
LDL Calculated: 109 mg/dL — ABNORMAL HIGH (ref 0–99)
Triglycerides: 64 mg/dL (ref 0–149)
VLDL Cholesterol Cal: 13 mg/dL (ref 5–40)

## 2019-04-10 ENCOUNTER — Encounter: Payer: Self-pay | Admitting: Family Medicine

## 2019-04-13 ENCOUNTER — Other Ambulatory Visit: Payer: Self-pay | Admitting: Family Medicine

## 2019-05-29 ENCOUNTER — Other Ambulatory Visit: Payer: Self-pay | Admitting: Family Medicine

## 2019-10-26 ENCOUNTER — Other Ambulatory Visit: Payer: Self-pay | Admitting: Family Medicine

## 2019-10-29 NOTE — Telephone Encounter (Signed)
Please ask patient to schedule follow up with me Thanks Lulani Bour J Fredonia Casalino, MD  

## 2019-10-30 NOTE — Telephone Encounter (Signed)
LMOVM for pt to call back and schedule a follow up appt with dr mcintyre. Canaan Prue, CMA  

## 2019-12-24 ENCOUNTER — Other Ambulatory Visit: Payer: Self-pay

## 2019-12-24 ENCOUNTER — Ambulatory Visit: Payer: BC Managed Care – PPO | Admitting: Family Medicine

## 2019-12-24 VITALS — BP 110/82 | HR 92 | Ht 65.5 in | Wt 155.8 lb

## 2019-12-24 DIAGNOSIS — R809 Proteinuria, unspecified: Secondary | ICD-10-CM | POA: Diagnosis not present

## 2019-12-24 DIAGNOSIS — E119 Type 2 diabetes mellitus without complications: Secondary | ICD-10-CM | POA: Diagnosis not present

## 2019-12-24 DIAGNOSIS — E7800 Pure hypercholesterolemia, unspecified: Secondary | ICD-10-CM | POA: Diagnosis not present

## 2019-12-24 LAB — POCT GLYCOSYLATED HEMOGLOBIN (HGB A1C): HbA1c, POC (controlled diabetic range): 7.3 % — AB (ref 0.0–7.0)

## 2019-12-24 MED ORDER — GLIPIZIDE ER 2.5 MG PO TB24: 2.5000 mg | ORAL_TABLET | Freq: Every day | ORAL | 3 refills | Status: DC

## 2019-12-24 MED ORDER — ATORVASTATIN CALCIUM 20 MG PO TABS: 20.0000 mg | ORAL_TABLET | Freq: Every day | ORAL | 3 refills | Status: DC

## 2019-12-24 MED ORDER — DILTIAZEM HCL ER 120 MG PO CP24: 120.0000 mg | ORAL_CAPSULE | Freq: Every day | ORAL | 1 refills | Status: DC

## 2019-12-24 NOTE — Progress Notes (Signed)
SUBJECTIVE:   CHIEF COMPLAINT / HPI:   Diabetes: She states that she has been doing well in regards to her diabetes. She does not regularly check her fasting blood sugars but when she does, it is usually in the 140s. No episodes of hypoglycemia. She is tolerating her medications well with the exception of jardiance which has caused intermittent vaginal itching. However she is tolerating this pretty well and symptoms have been happening less frequently recently. She would like to remain on this medication for now.  Microalbuminuria - she has not been taking her diltiazem as she does not want to take a large number of medications. She is agreeable to restarting it.  Hyperlipidemia: She has not been taking her atorvastatin for the same reason she has not been taking diltiazem. She is agreeable to restarting atorvastatin as well.   Knee pain: She has noticed pain along the outer portion of her L leg, just below the knee for the past couple months. She states that her knee sometimes pops when she bends it, and pain is mostly elicited by bending her knee. She does not recall any trauma or injury to the area. She stands long hours for work and thinks it may be related to overuse.   Left sided flank pain: She endorses left side costal/flank pain for the past week. She is worried that it may be kidney related but has no back pain. She does not recall any trauma, injury, or straining of the area. It is painful to the touch, however the pain has decreased in intensity in the last day. No urinary issues.  OBJECTIVE:   BP 110/82   Pulse 92   Ht 5' 5.5" (1.664 m)   Wt 155 lb 12.8 oz (70.7 kg)   LMP 09/29/2014   SpO2 99%   BMI 25.53 kg/m   Physical Exam Constitutional:      Appearance: Normal appearance.  Cardiovascular:     Pulses: Normal pulses.     Heart sounds: Normal heart sounds.  Pulmonary:     Effort: Pulmonary effort is normal.     Breath sounds: Normal breath sounds.  Abdominal:    Tenderness: There is no right CVA tenderness or left CVA tenderness. Mild superficial muscle tenderness over L flank/ribs Musculoskeletal:        General: Tenderness present.     Comments: Tenderness to palpation on the left side costal area Tenderness to palpation over anteriolateral left leg about 1/2 inch below midline of knee joint. No swelling or joint effusion. No joint tenderness. Full range of motion and strength of knee. Neurological:     Mental Status: She is alert.    ASSESSMENT/PLAN:   Health Maintenance - pneumovax - declined this today, will continue to address in the future - COVID vaccine - interested in getting. Provided her with information on how to sign up or walk in - eye exam - she will schedule with her eye doctor - urine microabumin/creatinine ratio - checking today  DM2 (diabetes mellitus, type 2) (HCC)  A1c remains controlled at 7.3, though up slightly from last time. Using shared decision making we decided to keep her on Jardiance as she stated that she was not bothered much by the yeast infections to come off of it. Check back in 3 mo to recheck   HLD (hyperlipidemia) Restarting Atorvastatin. Will check lipid panel at next visit in 3 months.  Moderately increased albuminuria Checking urine microalbumin/cre today. Restarting Diltiazem. Not a candidate for ACE  or ARB due to angioedema with ACE inhibitor.   Lateral knee pain Her symptoms may indicate a soft tissue irritation such as of the meniscus or bursa, though location of her pain is not typical. She declined knee x-ray at this time, stated pain is not that bothersome. Recommended supportive care such as icing and ibuprofen as needed.  Left sided costal pain This is likely muscular tenderness. Lack of CVA tenderness and flank pain makes this unlikely to be renal in origin. Given that it is improving in nature, she will monitor it for now.   Marisa Netters, MD [place]     Patient seen along with MS3 student [place]. I personally evaluated this patient along with the student, and verified all aspects of the history, physical exam, and medical decision making as documented by the student. I agree with the student's documentation and have made all necessary edits.  Marisa Netters, MD [place]

## 2019-12-24 NOTE — Patient Instructions (Addendum)
It was great to see you again today!  Stay on current medications Get back on atorvastatin and diltiazem  Schedule your eye appointment   Checking your urine  Go to [email or web address] to schedule your appointment  COVID-19 Vaccine Clinic Locations:  [facility]  [facility]) [place] [employer] [address] [place], [place] [address] Hours: Thu: 1-5 Type: Pfizer (12+)  [address] [facility] [place]) [place] [address] [place], [place] [address] Hours: Mon,Thu 8-5, Sat 8-12 Type: Pfizer (12+)  [place] [facility]) [facility] [employer] [address] [place], [place] [address] Hours: Fri,Sat 8-12, [place] 8-4 Type: Pfizer (12+)   Be well, Dr. Pollie Meyer

## 2019-12-24 NOTE — Assessment & Plan Note (Addendum)
A1c remains controlled at 7.3, though up slightly from last time. Using shared decision making we decided to keep her on Jardiance as she stated that she was not bothered much by the yeast infections to come off of it. Check back in 3 mo to recheck

## 2019-12-24 NOTE — Assessment & Plan Note (Signed)
Restarting Atorvastatin. Will check lipid panel at next visit in 3 months.

## 2019-12-24 NOTE — Assessment & Plan Note (Addendum)
Checking urine microalbumin/cre today. Restarting Diltiazem. Not a candidate for ACE or ARB due to angioedema with ACE inhibitor.

## 2019-12-26 ENCOUNTER — Other Ambulatory Visit: Payer: Self-pay | Admitting: Family Medicine

## 2019-12-26 MED ORDER — JARDIANCE 25 MG PO TABS: 25.0000 mg | ORAL_TABLET | Freq: Every day | ORAL | 3 refills | Status: DC

## 2019-12-26 MED ORDER — MICROLET LANCETS MISC: 3 refills | Status: DC

## 2019-12-26 MED ORDER — GLUCOSE BLOOD VI STRP: ORAL_STRIP | 12 refills | Status: DC

## 2019-12-26 MED ORDER — METFORMIN HCL 1000 MG PO TABS: 1000.0000 mg | ORAL_TABLET | Freq: Two times a day (BID) | ORAL | 3 refills | Status: DC

## 2019-12-27 LAB — MICROALBUMIN / CREATININE URINE RATIO
Creatinine, Urine: 70.9 mg/dL
Microalb/Creat Ratio: 38 mg/g creat — ABNORMAL HIGH (ref 0–29)
Microalbumin, Urine: 27 ug/mL

## 2020-01-10 ENCOUNTER — Encounter: Payer: Self-pay | Admitting: Family Medicine

## 2020-01-27 ENCOUNTER — Other Ambulatory Visit: Payer: Self-pay | Admitting: Family Medicine

## 2020-04-02 DIAGNOSIS — K1329 Other disturbances of oral epithelium, including tongue: Secondary | ICD-10-CM | POA: Diagnosis not present

## 2020-05-02 ENCOUNTER — Other Ambulatory Visit: Payer: Self-pay | Admitting: Family Medicine

## 2020-05-04 NOTE — Telephone Encounter (Signed)
Please ask patient to schedule a follow up visit with me for her diabetes  Thanks! Latrelle Dodrill, MD

## 2020-05-05 NOTE — Telephone Encounter (Signed)
LVM for patient to call office to schedule follow up appointment for diabetes.  Glennie Hawk, CMA

## 2020-05-11 ENCOUNTER — Telehealth: Payer: Self-pay

## 2020-05-11 NOTE — Telephone Encounter (Signed)
Patient calls nurse line requesting rx refills on cetirizine, jardiance and glipizide. Per chart review, patient should have refills at the pharmacy. Advised patient to contact pharmacy for refill request.   FYI to PCP  Veronda Prude, RN

## 2020-05-22 DIAGNOSIS — L438 Other lichen planus: Secondary | ICD-10-CM | POA: Diagnosis not present

## 2020-05-25 DIAGNOSIS — L438 Other lichen planus: Secondary | ICD-10-CM | POA: Diagnosis not present

## 2020-06-04 ENCOUNTER — Encounter: Payer: Self-pay | Admitting: Family Medicine

## 2020-06-04 ENCOUNTER — Other Ambulatory Visit: Payer: Self-pay

## 2020-06-04 ENCOUNTER — Ambulatory Visit: Payer: BC Managed Care – PPO | Admitting: Family Medicine

## 2020-06-04 VITALS — BP 110/72 | HR 80 | Wt 166.2 lb

## 2020-06-04 DIAGNOSIS — Z1211 Encounter for screening for malignant neoplasm of colon: Secondary | ICD-10-CM | POA: Diagnosis not present

## 2020-06-04 DIAGNOSIS — Z1159 Encounter for screening for other viral diseases: Secondary | ICD-10-CM

## 2020-06-04 DIAGNOSIS — E119 Type 2 diabetes mellitus without complications: Secondary | ICD-10-CM

## 2020-06-04 DIAGNOSIS — J309 Allergic rhinitis, unspecified: Secondary | ICD-10-CM

## 2020-06-04 DIAGNOSIS — E7800 Pure hypercholesterolemia, unspecified: Secondary | ICD-10-CM | POA: Diagnosis not present

## 2020-06-04 DIAGNOSIS — Z720 Tobacco use: Secondary | ICD-10-CM

## 2020-06-04 DIAGNOSIS — R809 Proteinuria, unspecified: Secondary | ICD-10-CM

## 2020-06-04 LAB — POCT GLYCOSYLATED HEMOGLOBIN (HGB A1C): Hemoglobin A1C: 8.7 % — AB (ref 4.0–5.6)

## 2020-06-04 NOTE — Patient Instructions (Signed)
It was great to see you again today!  Referring to nutritionist Work on diet and exercise  Referring to colonoscopy as well  Labs today  Go get your eye exam  Follow up in 3 months to recheck A1c  Be well, Dr. Pollie Meyer

## 2020-06-04 NOTE — Assessment & Plan Note (Addendum)
A1c at 8.7 today, up from 7.3 five months ago. Diet heavy in simple sugars, starches, and snack foods. Patient recognizes recent decline in the quality of her food and expresses motivation to change her diet and increase activity.  - Referral to nutrition - Will hold off on medication changes for now as she is motivated to institute lifestyle changes - Recheck A1c in three months - Diabetic foot exam done today - CMET, lipids today

## 2020-06-04 NOTE — Progress Notes (Addendum)
SUBJECTIVE:   CHIEF COMPLAINT / HPI:   Diabetes follow-up  PERTINENT  PMH / PSH:  DM2: Ms. Bost reports good adherence to her diabetes medications. Currently taking glipizide XL 2.5mg  daily, metformin 1000mg  twice daily, and jardiance 25mg  daily. She does check her sugars at home. Her CBGs are 98-110s fasting. A1c 8.7 today, up from 7.3 five months ago. She reports that her diet has been heavy in snack foods recently due to her working second shift and increased life stressors related to the pandemic. She also reports that her typical breakfast is a PB&J sandwich. She recognizes that these choices are contributing to the worsening of her A1c. She reports being very motivated to make lifestyle changes to address this and she is averse to medication changes at this time. She is amenable to meeting with a nutritionist to develop healthier snacking strategies.   Hyperlipidemia  She restarted her atorvastatin 20mg  daily 5 months ago and reports good adherence without side effects  Microalbuminuria  Taking diltiazem XR 120mg  daily (cannot take ACE/ARB)  Health maintenance: Patient recently received her second dose of COVID vaccine. Not interested in having flu vaccine at this time. Not interested in pneumococcal vaccine today. Is amenable to referral for colonoscopy. Recognizes need for diabetic eye exam.  Tobacco abuse Currently smoking 3 cigs/ week. Recently had biopsy of lesion under her tongue (dentist had noted it and had her see ENT doctor). 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  Allergic rhinitis - taking zyrtec 10mg  daily with good relief.  OBJECTIVE:   BP 110/72   Pulse 80   Wt 166 lb 3.2 oz (75.4 kg)   LMP 09/29/2014   SpO2 98%   BMI 27.24 kg/m    Physical Exam Vitals reviewed.  Constitutional:      General: She is not in acute distress. Cardiovascular:     Rate and Rhythm: Normal rate and regular rhythm.     Heart sounds: Normal heart sounds.  Pulmonary:      Effort: Pulmonary effort is normal.     Breath sounds: Normal breath sounds.  Musculoskeletal:        General: No swelling.     Right lower leg: No edema.     Left lower leg: No edema.  Neurological:     General: No focal deficit present.  Psychiatric:        Mood and Affect: Mood normal.        Thought Content: Thought content normal.    ASSESSMENT/PLAN:   DM2 (diabetes mellitus, type 2) (HCC) A1c at 8.7 today, up from 7.3 five months ago. Diet heavy in simple sugars, starches, and snack foods. Patient recognizes recent decline in the quality of her food and expresses motivation to change her diet and increase activity.  - Referral to nutrition - Will hold off on medication changes for now as she is motivated to institute lifestyle changes - Recheck A1c in three months - Diabetic foot exam done today - CMET, lipids today  HLD (hyperlipidemia) Tolerating statin, continue Update lipid panel today  Moderately increased albuminuria Tolerating diltiazem, continue  Tobacco abuse Counseled on smoking cessation techniques  Allergic rhinitis Well controlled on zyrtec, continue   Health maintenance:  - Declines flu and pneumococcal vaccines - Referral ordered for colonoscopy - Patient to make appt for eye exam - normal foot exam today - hep C antibody ordered today  , Medical Student [facility]   Patient seen along with MS3 student  Dorothyann Gibbs. I personally evaluated this patient along with the student, and verified all aspects of the history, physical exam, and medical decision making as documented by the student. I agree with the student's documentation and have made all necessary edits.  Levert Feinstein, MD  [facility]

## 2020-06-05 ENCOUNTER — Encounter: Payer: Self-pay | Admitting: Family Medicine

## 2020-06-05 DIAGNOSIS — Z87891 Personal history of nicotine dependence: Secondary | ICD-10-CM | POA: Insufficient documentation

## 2020-06-05 DIAGNOSIS — J309 Allergic rhinitis, unspecified: Secondary | ICD-10-CM | POA: Insufficient documentation

## 2020-06-05 DIAGNOSIS — Z720 Tobacco use: Secondary | ICD-10-CM | POA: Insufficient documentation

## 2020-06-05 LAB — LIPID PANEL
Chol/HDL Ratio: 2.9 ratio (ref 0.0–4.4)
Cholesterol, Total: 100 mg/dL (ref 100–199)
HDL: 35 mg/dL — ABNORMAL LOW (ref >39)
LDL Chol Calc (NIH): 52 mg/dL (ref 0–99)
Triglycerides: 55 mg/dL (ref 0–149)
VLDL Cholesterol Cal: 13 mg/dL (ref 5–40)

## 2020-06-05 LAB — CMP14+EGFR
ALT: 15 IU/L (ref 0–32)
AST: 14 IU/L (ref 0–40)
Albumin/Globulin Ratio: 1.4 (ref 1.2–2.2)
Albumin: 4.1 g/dL (ref 3.8–4.8)
Alkaline Phosphatase: 102 IU/L (ref 44–121)
BUN/Creatinine Ratio: 24 — ABNORMAL HIGH (ref 9–23)
BUN: 14 mg/dL (ref 6–24)
Bilirubin Total: 0.5 mg/dL (ref 0.0–1.2)
CO2: 26 mmol/L (ref 20–29)
Calcium: 9.7 mg/dL (ref 8.7–10.2)
Chloride: 105 mmol/L (ref 96–106)
Creatinine, Ser: 0.59 mg/dL (ref 0.57–1.00)
GFR calc Af Amer: 124 mL/min/{1.73_m2} (ref >59)
GFR calc non Af Amer: 107 mL/min/{1.73_m2} (ref >59)
Globulin, Total: 2.9 g/dL (ref 1.5–4.5)
Glucose: 88 mg/dL (ref 65–99)
Potassium: 4 mmol/L (ref 3.5–5.2)
Sodium: 142 mmol/L (ref 134–144)
Total Protein: 7 g/dL (ref 6.0–8.5)

## 2020-06-05 LAB — HCV AB W REFLEX TO QUANT PCR: HCV Ab: <0.1 s/co ratio (ref 0.0–0.9)

## 2020-06-05 LAB — HCV INTERPRETATION

## 2020-06-05 NOTE — Assessment & Plan Note (Signed)
Counseled on smoking cessation techniques

## 2020-06-05 NOTE — Assessment & Plan Note (Addendum)
Tolerating statin, continue Update lipid panel today

## 2020-06-05 NOTE — Assessment & Plan Note (Signed)
Tolerating diltiazem, continue

## 2020-06-05 NOTE — Assessment & Plan Note (Signed)
Well controlled on zyrtec, continue

## 2020-06-11 ENCOUNTER — Other Ambulatory Visit: Payer: Self-pay | Admitting: Family Medicine

## 2020-07-01 ENCOUNTER — Other Ambulatory Visit: Payer: Self-pay | Admitting: Family Medicine

## 2020-07-01 DIAGNOSIS — R809 Proteinuria, unspecified: Secondary | ICD-10-CM

## 2020-07-24 ENCOUNTER — Encounter: Payer: Self-pay | Admitting: Family Medicine

## 2020-07-27 ENCOUNTER — Encounter: Payer: Self-pay | Admitting: Registered"

## 2020-07-27 ENCOUNTER — Encounter: Payer: BC Managed Care – PPO | Attending: Family Medicine | Admitting: Registered"

## 2020-07-27 ENCOUNTER — Other Ambulatory Visit: Payer: Self-pay

## 2020-07-27 DIAGNOSIS — E1165 Type 2 diabetes mellitus with hyperglycemia: Secondary | ICD-10-CM | POA: Insufficient documentation

## 2020-07-27 NOTE — Progress Notes (Signed)
Diabetes Self-Management Education  Visit Type: First/Initial  Appt. Start Time: 0805 Appt. End Time: 0915  07/27/2020  Ms. Marisa Davis, identified by name and date of birth, is a 49 y.o. female with a diagnosis of Diabetes: Type 2.   ASSESSMENT  Height 5' 5.5" (1.664 m), weight 169 lb 3.2 oz (76.7 kg), last menstrual period 09/29/2014.  Body mass index is 27.73 kg/m.   A1c 8.7%. A1c >10% 2 yrs ago, pt states had been almost down to 7% and but stopped doing things that were helping to bring it down.  Medication: metformin, glipizide and Jardiance. Pt states she has been taking Jardiance less than a year, lost 10 lb at first without making any other changes but weight has now stabilized.     Physical activity: ADLs was going to gym before COVID. Pt states doesn't want to pay the gym membership to go back. Pt states she enjoys walking and will make a goal to increase.  Stress: 3-4/10 Works 3p-11p M-F; Sleeps 2 am- anywhere from 12 to 3 pm;  SMBG: checks when not feeling well and will be in 200s; when jittery drinks drinks big cup of orange juice but doesn't check blood sugar. Pt states symptoms happens less frequently than it used to.  Diet: Pt states she eats 2 meals per day. Pt reports when gets off work at 11 pm she may eat chips while watching TV. Pt states she watches TV because everyone else is asleep, nothing much else to do. Pt reports she drinks about 3 glasses of sweet tea on the weekends if she goes out to eat.   Diabetes Self-Management Education - 07/27/20 0811      Visit Information   Visit Type First/Initial      Initial Visit   Diabetes Type Type 2    Are you currently following a meal plan? No    Are you taking your medications as prescribed? Yes   metformin, glipizide and Jardiance   Date Diagnosed 25 yrs ago      Health Coping   How would you rate your overall health? Fair      Psychosocial Assessment   Patient Belief/Attitude about Diabetes Other  (comment)   challenge   How often do you need to have someone help you when you read instructions, pamphlets, or other written materials from your doctor or pharmacy? 1 - Never    What is the last grade level you completed in school? bachelors degree      Complications   Last HgB A1C per patient/outside source 8.7 %   10/28   How often do you check your blood sugar? 0 times/day (not testing)   checks when not feeling well, usually is over 200   Number of hypoglycemic episodes per month 0    Have you had a dilated eye exam in the past 12 months? No   plans to do in 2022   Have you had a dental exam in the past 12 months? Yes    Are you checking your feet? Yes    How many days per week are you checking your feet? 7      Dietary Intake   Breakfast egg salad sandwich    Snack (morning) PB crackers    Lunch chicken, vegetable, starch    Snack (afternoon) none    Dinner sandwich    Snack (evening) chips and cashews while watching TV    Beverage(s) apple juice, diet soda, 32 oz water, sweet tea  on weekends when eating out, occassional hard lemonade      Exercise   Exercise Type ADL's    How many days per week to you exercise? 0    How many minutes per day do you exercise? 0    Total minutes per week of exercise 0      Patient Education   Previous Diabetes Education Yes (please comment)   2013 NDES   Nutrition management  Role of diet in the treatment of diabetes and the relationship between the three main macronutrients and blood glucose level    Physical activity and exercise  Role of exercise on diabetes management, blood pressure control and cardiac health.    Medications Reviewed patients medication for diabetes, action, purpose, timing of dose and side effects.    Monitoring Purpose and frequency of SMBG.;Identified appropriate SMBG and/or A1C goals.    Chronic complications Relationship between chronic complications and blood glucose control;Assessed and discussed foot care and  prevention of foot problems    Psychosocial adjustment Role of stress on diabetes      Individualized Goals (developed by patient)   Nutrition General guidelines for healthy choices and portions discussed    Physical Activity Exercise 1-2 times per week    Medications take my medication as prescribed    Monitoring  test my blood glucose as discussed      Outcomes   Expected Outcomes Demonstrated interest in learning. Expect positive outcomes    Future DMSE PRN    Program Status Completed           Individualized Plan for Diabetes Self-Management Training:   Learning Objective:  Patient will have a greater understanding of diabetes self-management. Patient education plan is to attend individual and/or group sessions per assessed needs and concerns.    Patient Instructions  Get curious about why you are eating chips while watching TV. If you decide you want to still eat them, consider only taking 1 serving with you to the couch. Consider heating up your food in a dish that is not plastic Consider eating the whole fruit instead of drinking juice. Continue drinking plenty of water  Exercise: keep your mind open to what sounds enjoyable to you. Goal walk 2 days per week, 60 min per day. Sleep: consider getting 7-8 hrs consistently.  For an A1c of 7%: Fasting blood sugar, 80-130 mg/dL 2 hours after a meal less than 180 mg/dL   Expected Outcomes:  Demonstrated interest in learning. Expect positive outcomes  Education material provided: My Plate  If problems or questions, patient to contact team via:  Phone  Future DSME appointment: PRN

## 2020-07-27 NOTE — Patient Instructions (Addendum)
Get curious about why you are eating chips while watching TV. If you decide you want to still eat them, consider only taking 1 serving with you to the couch. Consider heating up your food in a dish that is not plastic Consider eating the whole fruit instead of drinking juice. Continue drinking plenty of water  Exercise: keep your mind open to what sounds enjoyable to you. Goal walk 2 days per week, 60 min per day. Sleep: consider getting 7-8 hrs consistently.  For an A1c of 7%: Fasting blood sugar, 80-130 mg/dL 2 hours after a meal less than 180 mg/dL

## 2020-08-04 ENCOUNTER — Telehealth: Payer: Self-pay | Admitting: Family Medicine

## 2020-08-04 DIAGNOSIS — Z1231 Encounter for screening mammogram for malignant neoplasm of breast: Secondary | ICD-10-CM

## 2020-08-04 NOTE — Telephone Encounter (Signed)
Called patient to remind her of scheduling mammogram, eye exam, and follow up with me.  Will send handout in mail on how to schedule mammogram (she requested this) Reminded her to schedule eye exam (has not yet done) Has colonoscopy scheduled in February  Scheduled follow up with me for late January  Patient appreciative.  Latrelle Dodrill, MD

## 2020-08-06 ENCOUNTER — Other Ambulatory Visit: Payer: Self-pay | Admitting: Family Medicine

## 2020-08-25 ENCOUNTER — Telehealth (INDEPENDENT_AMBULATORY_CARE_PROVIDER_SITE_OTHER): Payer: BC Managed Care – PPO | Admitting: Family Medicine

## 2020-08-25 NOTE — Progress Notes (Signed)
Attempted to reach patient for virtual visit (was left on schedule in case patient wanted to be seen virtually, and our office is closed today for inclement weather). No answer x2. Will no charge.  Latrelle Dodrill, MD

## 2020-09-01 ENCOUNTER — Other Ambulatory Visit: Payer: Self-pay

## 2020-09-01 ENCOUNTER — Ambulatory Visit (AMBULATORY_SURGERY_CENTER): Payer: Self-pay | Admitting: *Deleted

## 2020-09-01 VITALS — Ht 65.5 in | Wt 170.0 lb

## 2020-09-01 DIAGNOSIS — Z1211 Encounter for screening for malignant neoplasm of colon: Secondary | ICD-10-CM

## 2020-09-01 NOTE — Progress Notes (Signed)
Patient is here in-person for PV. Patient denies any allergies to eggs or soy. Patient denies any problems with anesthesia/sedation. Patient denies any oxygen use at home. Patient denies taking any diet/weight loss medications or blood thinners. Patient is not being treated for MRSA or C-diff. Patient is aware of our care-partner policy and Covid-19 safety protocol. EMMI education assigned to the patient for the procedure, info given to pt.   COVID-19 vaccines completed on 04/10/20 x2, per patient.

## 2020-09-11 ENCOUNTER — Encounter: Payer: Self-pay | Admitting: Internal Medicine

## 2020-09-15 ENCOUNTER — Other Ambulatory Visit: Payer: Self-pay

## 2020-09-15 ENCOUNTER — Encounter: Payer: Self-pay | Admitting: Internal Medicine

## 2020-09-15 ENCOUNTER — Ambulatory Visit (AMBULATORY_SURGERY_CENTER): Payer: BC Managed Care – PPO | Admitting: Internal Medicine

## 2020-09-15 VITALS — BP 129/78 | HR 71 | Temp 97.8°F | Resp 19 | Ht 65.5 in | Wt 170.0 lb

## 2020-09-15 DIAGNOSIS — Z1211 Encounter for screening for malignant neoplasm of colon: Secondary | ICD-10-CM

## 2020-09-15 HISTORY — PX: COLONOSCOPY: SHX174

## 2020-09-15 MED ORDER — SODIUM CHLORIDE 0.9 % IV SOLN: 500.0000 mL | Freq: Once | INTRAVENOUS | Status: DC

## 2020-09-15 NOTE — Op Note (Signed)
[facility] Patient Name: Marisa Davis Procedure Date: 09/15/2020 9:20 AM MRN: [id number] Endoscopist: Iva Boop , MD Age: 50 Referring MD:  Date of Birth: [birth year] Gender: Female Account #: [id number] Procedure:                Colonoscopy Indications:              Screening for colorectal malignant neoplasm, This                            is the patient's first colonoscopy Medicines:                Propofol per Anesthesia, Monitored Anesthesia Care Procedure:                Pre-Anesthesia Assessment:                           - Prior to the procedure, a History and Physical                            was performed, and patient medications and                            allergies were reviewed. The patient's tolerance of                            previous anesthesia was also reviewed. The risks                            and benefits of the procedure and the sedation                            options and risks were discussed with the patient.                            All questions were answered, and informed consent                            was obtained. Prior Anticoagulants: The patient has                            taken no previous anticoagulant or antiplatelet                            agents. ASA Grade Assessment: II - A patient with                            mild systemic disease. After reviewing the risks                            and benefits, the patient was deemed in                            satisfactory condition to undergo the procedure.  After obtaining informed consent, the colonoscope                            was passed under direct vision. Throughout the                            procedure, the patient's blood pressure, pulse, and                            oxygen saturations were monitored continuously. The                            Olympus PFC-[id number] (#[id number]) Colonoscope was                             introduced through the anus and advanced to the the                            cecum, identified by appendiceal orifice and                            ileocecal valve. The quality of the bowel                            preparation was good. The colonoscopy was performed                            without difficulty. The patient tolerated the                            procedure well. The bowel preparation used was                            Miralax via split dose instruction. Scope In: 9:28:36 AM Scope Out: 9:44:46 AM Scope Withdrawal Time: 0 hours 11 minutes 8 seconds  Total Procedure Duration: 0 hours 16 minutes 10 seconds  Findings:                 The perianal and digital rectal examinations were                            normal.                           The colon (entire examined portion) appeared normal.                           No additional abnormalities were found on                            retroflexion. Complications:            No immediate complications. Estimated blood loss:                            None. Estimated Blood Loss:  Estimated blood loss: none. Impression:               - The entire examined colon is normal.                           - No specimens collected. Recommendation:           - Repeat colonoscopy in 10 years for screening                            purposes.                           - Patient has a contact number available for                            emergencies. The signs and symptoms of potential                            delayed complications were discussed with the                            patient. Return to normal activities tomorrow.                            Written discharge instructions were provided to the                            patient.                           - Resume previous diet.                           - Continue present medications. Iva Boop, MD 09/15/2020 9:54:09 AM This report has been signed  electronically.

## 2020-09-15 NOTE — Patient Instructions (Addendum)
The colonoscopy was normal.  No polyps or cancer seen.  Next routine colonoscopy or other screening test in 10 years - 2032.  I appreciate the opportunity to care for you. Iva Boop, MD, FACG   YOU HAD AN ENDOSCOPIC PROCEDURE TODAY AT THE [facility]:   Refer to the procedure report that was given to you for any specific questions about what was found during the examination.  If the procedure report does not answer your questions, please call your gastroenterologist to clarify.  If you requested that your care partner not be given the details of your procedure findings, then the procedure report has been included in a sealed envelope for you to review at your convenience later.  YOU SHOULD EXPECT: Some feelings of bloating in the abdomen. Passage of more gas than usual.  Walking can help get rid of the air that was put into your GI tract during the procedure and reduce the bloating. If you had a lower endoscopy (such as a colonoscopy or flexible sigmoidoscopy) you may notice spotting of blood in your stool or on the toilet paper. If you underwent a bowel prep for your procedure, you may not have a normal bowel movement for a few days.  Please Note:  You might notice some irritation and congestion in your nose or some drainage.  This is from the oxygen used during your procedure.  There is no need for concern and it should clear up in a day or so.  SYMPTOMS TO REPORT IMMEDIATELY:   Following lower endoscopy (colonoscopy or flexible sigmoidoscopy):  Excessive amounts of blood in the stool  Significant tenderness or worsening of abdominal pains  Swelling of the abdomen that is new, acute  Fever of 100F or higher    For urgent or emergent issues, a gastroenterologist can be reached at any hour by calling [phone]. Do not use MyChart messaging for urgent concerns.    DIET:  We do recommend a small meal at first, but then you may proceed to your regular diet.  Drink  plenty of fluids but you should avoid alcoholic beverages for 24 hours.  ACTIVITY:  You should plan to take it easy for the rest of today and you should NOT DRIVE or use heavy machinery until tomorrow (because of the sedation medicines used during the test).    FOLLOW UP: Our staff will call the number listed on your records 48-72 hours following your procedure to check on you and address any questions or concerns that you may have regarding the information given to you following your procedure. If we do not reach you, we will leave a message.  We will attempt to reach you two times.  During this call, we will ask if you have developed any symptoms of COVID 19. If you develop any symptoms (ie: fever, flu-like symptoms, shortness of breath, cough etc.) before then, please call [phone].  If you test positive for Covid 19 in the 2 weeks post procedure, please call and report this information to [place].    If any biopsies were taken you will be contacted by phone or by letter within the next 1-3 weeks.  Please call us at [phone] if you have not heard about the biopsies in 3 weeks.    SIGNATURES/CONFIDENTIALITY: You and/or your care partner have signed paperwork which will be entered into your electronic medical record.  These signatures attest to the fact that that the information above on your After Visit Summary  has been reviewed and is understood.  Full responsibility of the confidentiality of this discharge information lies with you and/or your care-partner. 

## 2020-09-15 NOTE — Progress Notes (Signed)
Pt's states no medical or surgical changes since previsit or office visit.  ° °Vitals CW °

## 2020-09-15 NOTE — Progress Notes (Signed)
PT taken to PACU. Monitors in place. VSS. Report given to RN. 

## 2020-09-17 ENCOUNTER — Telehealth: Payer: Self-pay | Admitting: *Deleted

## 2020-09-17 ENCOUNTER — Telehealth: Payer: Self-pay

## 2020-09-17 NOTE — Telephone Encounter (Signed)
Left message on follow up call. 

## 2020-09-17 NOTE — Telephone Encounter (Signed)
  Follow up Call-  Call back number 09/15/2020  Post procedure Call Back phone  # [phone]  Permission to leave phone message Yes  Some recent data might be hidden     No answer at 2nd attempt follow up phone call.  Left message on voicemail.

## 2021-01-04 ENCOUNTER — Other Ambulatory Visit: Payer: Self-pay | Admitting: Family Medicine

## 2021-01-04 DIAGNOSIS — E7800 Pure hypercholesterolemia, unspecified: Secondary | ICD-10-CM

## 2021-01-07 NOTE — Telephone Encounter (Signed)
Please let patient know I am refilling this medication, but she needs to schedule an appointment with me.   Thanks, Valerio Pinard J Annabell Oconnor, MD  

## 2021-01-08 NOTE — Telephone Encounter (Signed)
LVM for patient to call [facility] and schedule an appointment.  .Juanna Pudlo R Roxy Mastandrea, CMA  

## 2021-01-18 ENCOUNTER — Ambulatory Visit
Admission: RE | Admit: 2021-01-18 | Discharge: 2021-01-18 | Disposition: A | Discharge status: Home / Self Care | Payer: BC Managed Care – PPO | Source: Ambulatory Visit | Attending: Family Medicine | Admitting: Family Medicine

## 2021-01-18 ENCOUNTER — Other Ambulatory Visit: Payer: Self-pay

## 2021-01-18 DIAGNOSIS — Z1231 Encounter for screening mammogram for malignant neoplasm of breast: Secondary | ICD-10-CM

## 2021-02-08 ENCOUNTER — Other Ambulatory Visit: Payer: Self-pay | Admitting: Family Medicine

## 2021-02-16 ENCOUNTER — Other Ambulatory Visit: Payer: Self-pay | Admitting: Family Medicine

## 2021-02-23 ENCOUNTER — Other Ambulatory Visit: Payer: Self-pay

## 2021-02-23 ENCOUNTER — Ambulatory Visit: Payer: BC Managed Care – PPO | Admitting: Family Medicine

## 2021-02-23 ENCOUNTER — Encounter: Payer: Self-pay | Admitting: Family Medicine

## 2021-02-23 VITALS — BP 110/70 | HR 70 | Ht 66.0 in | Wt 176.2 lb

## 2021-02-23 DIAGNOSIS — E119 Type 2 diabetes mellitus without complications: Secondary | ICD-10-CM

## 2021-02-23 DIAGNOSIS — R809 Proteinuria, unspecified: Secondary | ICD-10-CM

## 2021-02-23 DIAGNOSIS — Z720 Tobacco use: Secondary | ICD-10-CM | POA: Diagnosis not present

## 2021-02-23 LAB — POCT GLYCOSYLATED HEMOGLOBIN (HGB A1C): HbA1c, POC (controlled diabetic range): 8.1 % — AB (ref 0.0–7.0)

## 2021-02-23 NOTE — Progress Notes (Signed)
  Date of Visit: 02/23/2021   SUBJECTIVE:   HPI:  Marisa Davis presents today for routine follow up.  Diabetes - taking jardiance 25mg  daily, glipizide Xl 2.5 mg daily, and metformin 1000mg  twice daily. A1c today 8.1. fasting sugars run 89-100. Patient admits there is room for improvement in her diet, and for increased exercise.  Continues on diltiazem for elevated microalb:creat ratio in urine. Has history of angioedema with lisinopril so cannot take ACEs or ARBs  Has quit smoking!  OBJECTIVE:   BP 110/70   Pulse 70   Ht 5\' 6"  (1.676 m)   Wt 176 lb 3.2 oz (79.9 kg)   LMP 09/29/2014   SpO2 100%   BMI 28.44 kg/m  Gen: no acute distress, pleasant cooperative HEENT: normocephalic, atraumatic  Heart: regular rate and rhythm, no murmur Lungs: clear to auscultation bilaterally, normal work of breathing  Neuro: alert, speech normal, grossly nonfocal Ext: No appreciable lower extremity edema bilaterally   ASSESSMENT/PLAN:   Health maintenance:  -encouraged prevnar 20 vaccine, patient declines, states next time we can discuss -also gave info on shingrix, patient prefers to defer at this time -update urine microalbumin today -encouraged to schedule eye exam as she is now past due  DM2 (diabetes mellitus, type 2) (HCC) Room for improvement in A1c. Patient will work on lifestyle measures Continue current medications Follow up in 3 months   Moderately increased albuminuria Taking diltiazem for this as she cannot take ACEs or ARBs. Update urine microalbumin:creat ratio today  Tobacco abuse Congratulated her on quitting smoking  FOLLOW UP: Follow up in 3 mos for next A1c  J. , MD [facility]

## 2021-02-23 NOTE — Patient Instructions (Signed)
It was great to see you again today!  Congratulations on quitting smoking. That is AMAZING and I am so proud of you!  Work on exercise and diet like you have been doing Schedule your eye exam and ask them to send [place] a record of the exam  Follow up with me in 3 months, sooner if needed  Be well, Dr. Pollie Meyer

## 2021-02-24 LAB — MICROALBUMIN / CREATININE URINE RATIO
Creatinine, Urine: 61.2 mg/dL
Microalb/Creat Ratio: 56 mg/g creat — ABNORMAL HIGH (ref 0–29)
Microalbumin, Urine: 34.5 ug/mL

## 2021-03-01 ENCOUNTER — Encounter: Payer: Self-pay | Admitting: Family Medicine

## 2021-03-01 NOTE — Assessment & Plan Note (Signed)
Room for improvement in A1c. Patient will work on lifestyle measures Continue current medications Follow up in 3 months

## 2021-03-01 NOTE — Assessment & Plan Note (Signed)
Congratulated her on quitting smoking. 

## 2021-03-01 NOTE — Assessment & Plan Note (Signed)
Taking diltiazem for this as she cannot take ACEs or ARBs. Update urine microalbumin:creat ratio today

## 2021-03-08 ENCOUNTER — Other Ambulatory Visit: Payer: Self-pay | Admitting: Family Medicine

## 2021-03-24 ENCOUNTER — Encounter: Payer: Self-pay | Admitting: Family Medicine

## 2021-04-30 ENCOUNTER — Other Ambulatory Visit: Payer: Self-pay

## 2021-04-30 MED ORDER — METFORMIN HCL 1000 MG PO TABS: 1000.0000 mg | ORAL_TABLET | Freq: Two times a day (BID) | ORAL | 2 refills | Status: DC

## 2021-04-30 NOTE — Telephone Encounter (Signed)
Please let patient know I am refilling this medication, but I'd like her to schedule an appointment with me for late October or November - this will let me see her one more time before I go out on maternity leave.  Thanks, Latrelle Dodrill, MD

## 2021-05-03 NOTE — Telephone Encounter (Signed)
LMOVM informing pt and asked her to make an appt to see dr Pollie Meyer in late oct or nov. Ayah Cozzolino Bruna Potter, CMA

## 2021-05-18 ENCOUNTER — Other Ambulatory Visit: Payer: Self-pay | Admitting: Family Medicine

## 2021-06-22 ENCOUNTER — Other Ambulatory Visit: Payer: Self-pay | Admitting: Family Medicine

## 2021-07-10 ENCOUNTER — Other Ambulatory Visit: Payer: Self-pay | Admitting: Family Medicine

## 2021-07-10 DIAGNOSIS — R809 Proteinuria, unspecified: Secondary | ICD-10-CM

## 2021-07-10 DIAGNOSIS — E7800 Pure hypercholesterolemia, unspecified: Secondary | ICD-10-CM

## 2021-08-10 ENCOUNTER — Other Ambulatory Visit: Payer: Self-pay | Admitting: Family Medicine

## 2021-09-21 DIAGNOSIS — E113293 Type 2 diabetes mellitus with mild nonproliferative diabetic retinopathy without macular edema, bilateral: Secondary | ICD-10-CM | POA: Diagnosis not present

## 2021-09-21 DIAGNOSIS — H2513 Age-related nuclear cataract, bilateral: Secondary | ICD-10-CM | POA: Diagnosis not present

## 2021-09-21 DIAGNOSIS — H524 Presbyopia: Secondary | ICD-10-CM | POA: Diagnosis not present

## 2021-09-21 DIAGNOSIS — H35033 Hypertensive retinopathy, bilateral: Secondary | ICD-10-CM | POA: Diagnosis not present

## 2021-10-12 ENCOUNTER — Other Ambulatory Visit: Payer: Self-pay | Admitting: Family Medicine

## 2021-10-12 DIAGNOSIS — R809 Proteinuria, unspecified: Secondary | ICD-10-CM

## 2021-10-12 DIAGNOSIS — E7800 Pure hypercholesterolemia, unspecified: Secondary | ICD-10-CM

## 2021-11-09 ENCOUNTER — Other Ambulatory Visit: Payer: Self-pay | Admitting: Family Medicine

## 2021-11-09 NOTE — Telephone Encounter (Signed)
Please let patient know I am refilling this medication, but she needs to schedule an appointment with me to follow up on her diabetes. ? ?Thanks, ?Leeanne Rio, MD  ?

## 2021-11-23 ENCOUNTER — Ambulatory Visit: Payer: BC Managed Care – PPO | Admitting: Family Medicine

## 2021-11-23 VITALS — BP 112/75 | HR 77 | Wt 182.4 lb

## 2021-11-23 DIAGNOSIS — E7800 Pure hypercholesterolemia, unspecified: Secondary | ICD-10-CM

## 2021-11-23 DIAGNOSIS — Z23 Encounter for immunization: Secondary | ICD-10-CM | POA: Diagnosis not present

## 2021-11-23 DIAGNOSIS — E119 Type 2 diabetes mellitus without complications: Secondary | ICD-10-CM | POA: Diagnosis not present

## 2021-11-23 DIAGNOSIS — Z720 Tobacco use: Secondary | ICD-10-CM

## 2021-11-23 LAB — POCT GLYCOSYLATED HEMOGLOBIN (HGB A1C): HbA1c, POC (controlled diabetic range): 8.4 % — AB (ref 0.0–7.0)

## 2021-11-23 MED ORDER — SHINGRIX 50 MCG/0.5ML IM SUSR: 0.5000 mL | Freq: Once | INTRAMUSCULAR | 1 refills | Status: AC

## 2021-11-23 NOTE — Progress Notes (Signed)
?  Date of Visit: 11/23/2021  ? ?SUBJECTIVE:  ? ?HPI: ? ?Marisa Davis presents today for routine follow up of diabetes.  ? ?Diabetes: A1c is up to 8.4 today from 8.1 in July 2022.She is taking jardiance 25mg  daily, glipizide XI 2.5 daily, and metformin 1000mg  only once daily. Fasting sugars run 119-150, and bedtime sugars run 200-297. Patient is surprised by her higher A1c, she has been working on improving her diet and exercise. She describes using "intermittent fasting" as a method of diet and weight control.  ? ?Marisa Davis reports significant generalized itching associated with taking the jardiance. The itching began when she started the jardiance, and improved when she was taking cetirizine along with the jardiance, but she does not want to continue taking the cetirizine. Would like to stop jardiance. ? ?She endorses hypoglycemic symptoms during the middle of her work shift 2x per week, but has not checked her sugars during these episodes.  ? ?She has continued to not smoke tobacco.  ? ?OBJECTIVE:  ? ?BP 112/75   Pulse 77   Wt 182 lb 6.4 oz (82.7 kg)   LMP 09/29/2014   SpO2 99%   BMI 29.44 kg/m?  ?Gen: Well-appearing, in no acute distress, pleasant ?HEENT: normocephalic, atraumatic  ?Heart: regular rate and rhythm, no murmurs ?Lungs: clear to auscultation bilaterally, normal work of breathing ?Neuro: alert, normal speech ?Skin: excoriations from itching on right wrist and right flank area ? ?ASSESSMENT/PLAN:  ? ?Health maintenance:  ?- Administered prevnar 20 ?- Gave prescription for shingrix to get at pharmacy ?- Scheduled pap smear in 3 weeks ?- Received eye exam February 18, will request records ?- defer COVID booster to future appointment  ? ?DM2 (diabetes mellitus, type 2) (HCC) ?Uncontrolled. Given itching on jardiance, will discontinue, and given symptoms concerning for hypoglycemic episodes, will discontinue glipizide. Counseled patient to increase metformin to prescribed dose of 1000 twice a day. Will also  add a GLP-1 receptor agonist, patient will see pharmacy on 4/20 and they will provide counseling and see which option her insurance will cover. Encouraged to check sugars any time she feels like sugar may be dropping. ? ?HLD (hyperlipidemia) ?Update lipids today. Continue statin ? ?Tobacco abuse ?Congratulated on continued abstinence from tobacco ? ?FOLLOW UP: ?Follow up on May 2nd  for pap, COVID-19 booster ? ?5/20, Medical Student ?[facility] ? ?Patient seen along with MS3 student May 4. I personally evaluated this patient along with the student, and verified all aspects of the history, physical exam, and medical decision making as documented by the student. I agree with the student's documentation and have made all necessary edits. ? ?Marisa Milling, MD  ?[facility]   ?. ? ?

## 2021-11-23 NOTE — Assessment & Plan Note (Addendum)
Uncontrolled. Given itching on jardiance, will discontinue, and given symptoms concerning for hypoglycemic episodes, will discontinue glipizide. Counseled patient to increase metformin to prescribed dose of 1000 twice a day. Will also add a GLP-1 receptor agonist, patient will see pharmacy on 4/20 and they will provide counseling and see which option her insurance will cover. Encouraged to check sugars any time she feels like sugar may be dropping. ?

## 2021-11-23 NOTE — Progress Notes (Signed)
1c 

## 2021-11-23 NOTE — Patient Instructions (Signed)
It was great to see you again today! ? ?Pneumonia shot today ?Take shingles vaccine rx to your pharmacy ?Labs today - kidneys, cholesterol ?See pharmacy clinic to start injectible medication ?Stop glipizide and jardiance ?Go up to twice daily on metformin ?Follow up with me on 5/2 as scheduled for pap ?Check sugars if dropping low ? ?Be well, ?Dr. Pollie Meyer  ?

## 2021-11-24 ENCOUNTER — Encounter: Payer: Self-pay | Admitting: Family Medicine

## 2021-11-24 LAB — CMP14+EGFR
ALT: 13 IU/L (ref 0–32)
AST: 16 IU/L (ref 0–40)
Albumin/Globulin Ratio: 1.7 (ref 1.2–2.2)
Albumin: 4.5 g/dL (ref 3.8–4.9)
Alkaline Phosphatase: 113 IU/L (ref 44–121)
BUN/Creatinine Ratio: 22 (ref 9–23)
BUN: 13 mg/dL (ref 6–24)
Bilirubin Total: 0.6 mg/dL (ref 0.0–1.2)
CO2: 23 mmol/L (ref 20–29)
Calcium: 9.6 mg/dL (ref 8.7–10.2)
Chloride: 104 mmol/L (ref 96–106)
Creatinine, Ser: 0.6 mg/dL (ref 0.57–1.00)
Globulin, Total: 2.7 g/dL (ref 1.5–4.5)
Glucose: 132 mg/dL — ABNORMAL HIGH (ref 70–99)
Potassium: 4 mmol/L (ref 3.5–5.2)
Sodium: 143 mmol/L (ref 134–144)
Total Protein: 7.2 g/dL (ref 6.0–8.5)
eGFR: 109 mL/min/{1.73_m2} (ref >59)

## 2021-11-24 LAB — LIPID PANEL
Chol/HDL Ratio: 3 ratio (ref 0.0–4.4)
Cholesterol, Total: 126 mg/dL (ref 100–199)
HDL: 42 mg/dL (ref >39)
LDL Chol Calc (NIH): 67 mg/dL (ref 0–99)
Triglycerides: 89 mg/dL (ref 0–149)
VLDL Cholesterol Cal: 17 mg/dL (ref 5–40)

## 2021-11-25 ENCOUNTER — Other Ambulatory Visit (HOSPITAL_COMMUNITY): Payer: Self-pay

## 2021-11-25 ENCOUNTER — Ambulatory Visit (INDEPENDENT_AMBULATORY_CARE_PROVIDER_SITE_OTHER): Payer: BC Managed Care – PPO | Admitting: Pharmacist

## 2021-11-25 ENCOUNTER — Encounter: Payer: Self-pay | Admitting: Pharmacist

## 2021-11-25 VITALS — BP 123/84 | Wt 182.4 lb

## 2021-11-25 DIAGNOSIS — E119 Type 2 diabetes mellitus without complications: Secondary | ICD-10-CM | POA: Diagnosis not present

## 2021-11-25 DIAGNOSIS — T783XXA Angioneurotic edema, initial encounter: Secondary | ICD-10-CM | POA: Insufficient documentation

## 2021-11-25 MED ORDER — METFORMIN HCL ER 500 MG PO TB24: 1000.0000 mg | ORAL_TABLET | Freq: Every day | ORAL | 3 refills | Status: DC

## 2021-11-25 MED ORDER — OZEMPIC (0.25 OR 0.5 MG/DOSE) 2 MG/1.5ML ~~LOC~~ SOPN: 0.2500 mg | PEN_INJECTOR | SUBCUTANEOUS | 1 refills | Status: DC

## 2021-11-25 MED ORDER — INSULIN PEN NEEDLE 31G X 5 MM MISC: 1 refills | Status: DC

## 2021-11-25 NOTE — Progress Notes (Signed)
? ?S:    ? ?Chief Complaint  ?Patient presents with  ? Medication Management  ?  Diabetes F/u  ? ?Marisa Davis is a 51 y.o. female who presents for diabetes evaluation, education, and management. PMH is significant for T2DM, HLD. Patient was referred and last seen by Primary Care Provider, Dr. Pollie Meyer, on 11/23/21. At last visit, patient reported itching with Jardiance which was stopped. Glipizide was also stopped due to reports of hypoglycemia. She was referred to pharmacy clinic to start a GLP-1 RA covered by her insurance.  ? ?Today, patient arrives in good spirits and presents without assistance. She reports that she stopped taking Jardiance on Tuesday and also ran out of her allergy medication and yesterday her lips swelled up. She had no trouble breathing. She started taking Zyrtec (cetirizine) again and the swelling has started to go down but is still present today. She also reports that she increased her metformin to twice daily as instructed by Dr. Pollie Meyer and vomited so she is taking it daily.  ? ?Family/Social History:  ?-Former smoker ? ?Current diabetes medications include: metformin 1000 mg daily ?Past medications include: Jardiance (empagliflozin) (itching), glipizide (hypoglycemia) ? ?Patient reports taking all medications as prescribed. Patient reports adherence with medications. Patient reports missing her medications 0 times per week, on average. ? ?Do you feel that your medications are working for you? yes ?Have you been experiencing any side effects to the medications prescribed? Yes - vomiting when took metformin BID ?Do you have any problems obtaining medications due to transportation or finances? no ?Insurance coverage: BCBS [employer], [employer]) ? ?Patient denies hypoglycemic events. ? ?Reported home fasting blood sugars: 119  ?Reported 2 hour post-meal/random blood sugars: 297 ? ?Patient reports nocturia (nighttime urination). At least twice a night.  ?Patient denies neuropathy (nerve  pain). ?Patient denies visual changes. ?Patient reports self foot exams.  ? ?Patient reported dietary habits: Eats 3 meals/day ?Breakfast: raisin bran crunch, boiled egg ?Lunch: sandwich or salad ?Dinner: chicken, starch, vegetable ?Snacks: chips ?Drinks: diet coke, water ? ?Within the past 12 months, did you worry whether your food would run out before you got money to buy more? no ?Within the past 12 months, did the food you bought run out, and you didn?t have money to get more? no ? ?Patient-reported exercise habits: hoping to start walking with her husband ? ?O:  ?Physical Exam ?Vitals reviewed.  ?HENT:  ?   Mouth/Throat:  ?   Comments: Swelling of lips ?Cardiovascular:  ?   Rate and Rhythm: Normal rate.  ?Pulmonary:  ?   Effort: Pulmonary effort is normal.  ?Neurological:  ?   Mental Status: She is alert.  ?Psychiatric:     ?   Mood and Affect: Mood normal.     ?   Behavior: Behavior normal.     ?   Thought Content: Thought content normal.  ? ?Review of Systems  ?HENT:    ?     Swelling of lips  ?All other systems reviewed and are negative. ? ?Lab Results  ?Component Value Date  ? HGBA1C 8.4 (A) 11/23/2021  ? ?There were no vitals filed for this visit. ? ?Lipid Panel  ?   ?Component Value Date/Time  ? CHOL 126 11/23/2021 1022  ? TRIG 89 11/23/2021 1022  ? HDL 42 11/23/2021 1022  ? CHOLHDL 3.0 11/23/2021 1022  ? CHOLHDL 4.4 12/22/2015 1102  ? VLDL 15 12/22/2015 1102  ? LDLCALC 67 11/23/2021 1022  ? LDLDIRECT 163 (H)  11/12/2013 0931  ? ? ?Clinical Atherosclerotic Cardiovascular Disease (ASCVD): No  ?The ASCVD Risk score (Arnett DK, et al., 2019) failed to calculate for the following reasons: ?  The valid total cholesterol range is 130 to 320 mg/dL  ? ? ?A/P: ?Diabetes longstanding currently uncontrolled with last A1c up to 8.4. Patient is able to verbalize appropriate hypoglycemia management plan. Medication adherence appears appropriate. Control is suboptimal due to requiring additional medications. Will start  GLP-1 RA. Ozempic is covered on her insurance without a PA for $0. ?-Start Ozempic (semaglutide) 0.25 mg weekly.  ?-Switched metformin to extended release formulation. Continued current dose of 1000 mg daily.  ?-Patient educated on purpose, proper use, and potential adverse effects of Ozempic.  ?-Extensively discussed pathophysiology of diabetes, recommended lifestyle interventions, dietary effects on blood sugar control.  ?-Counseled on s/sx of and management of hypoglycemia.  ?-Next A1c anticipated July 2023. ? ?Angioedema:  ?Patient with lip swelling that occurred after stopping Jardiance (empagliflozin) and Zyrtec (cetirizine). No new medications. No airway involvement. Has started to improve with resuming Zyrtec. Unclear if this is related to a medication, food, or idiopathic. Consulted Dr. McDiarmid who evaluated the patient in clinic today and recommended a referral to an allergist which has been placed. Counseled patient to see emergent care if she ever experiences swelling of the lips that affects her breathing.   ? ?Written patient instructions provided. Patient verbalized understanding of treatment plan. Total time in face to face counseling 42 minutes.   ? ?Follow up pharmacist clinic visit in 4 weeks. Patient seen with Bartolo Darter PharmD Candidate and Pervis Hocking, PharmD - PGY2 Pharmacy Resident.  ?

## 2021-11-25 NOTE — Progress Notes (Signed)
Reviewed: I agree with Dr. Koval's documentation and management. 

## 2021-11-25 NOTE — Progress Notes (Signed)
I saw the patient with Dr Raymondo Band.  I agree with his assessment of angioedema without threat to airway.  Her angioedema is possibly related to her Jardiance.   ?I agree with continuing the cetirizine daily at least until the angioedema has completely resolved.  ? ?Given that this is the patient's second episode of angioedema, it seems reasonable for her to consult with Allergy/Immunology to see if they believe this second episode of angioedema is related to [facility] or some other agent.  ?

## 2021-11-25 NOTE — Patient Instructions (Addendum)
It was nice to see you today! ? ?Your goal blood sugar is 80-130 before eating and less than 180 after eating. ? ?Medication Changes: ?Begin Ozempic 0.25 mg weekly.  ? ?Switch metformin to the extended release tablets which are better tolerated. Take TWO 500 mg tablets once daily.  ? ?Monitor blood sugars at home and keep a log (glucometer or piece of paper) to bring with you to your next visit. ? ?Keep up the good work with diet and exercise. Aim for a diet full of vegetables, fruit and lean meats (chicken, [place], fish). Try to limit salt intake by eating fresh or frozen vegetables (instead of canned), rinse canned vegetables prior to cooking and do not add any additional salt to meals.  ? ?Your next appointment is Thursday May 18th at 9:00.  ? ? ? ?

## 2021-11-25 NOTE — Assessment & Plan Note (Signed)
Patient with lip swelling that occurred after stopping Jardiance (empagliflozin) and Zyrtec (cetirizine). No new medications. No airway involvement. Has started to improve with resuming Zyrtec. Unclear if this is related to a medication, food, or idiopathic. Consulted Dr. McDiarmid who evaluated the patient in clinic today and recommended a referral to an allergist which has been placed. Counseled patient to see emergent care if she ever experiences swelling of the lips that affects her breathing.   ?

## 2021-11-25 NOTE — Assessment & Plan Note (Signed)
Diabetes longstanding currently uncontrolled with last A1c up to 8.4. Patient is able to verbalize appropriate hypoglycemia management plan. Medication adherence appears appropriate. Control is suboptimal due to requiring additional medications. Will start GLP-1 RA. Ozempic is covered on her insurance without a PA for $0. ?-Start Ozempic (semaglutide) 0.25 mg weekly.  ?-Switched metformin to extended release formulation. Continued current dose of 1000 mg daily.  ?-Patient educated on purpose, proper use, and potential adverse effects of Ozempic.  ?

## 2021-11-26 NOTE — Assessment & Plan Note (Signed)
Update lipids today.  Continue statin. 

## 2021-11-26 NOTE — Assessment & Plan Note (Signed)
-   Congratulated on continued abstinence from tobacco.

## 2021-12-07 ENCOUNTER — Other Ambulatory Visit: Payer: Self-pay

## 2021-12-07 ENCOUNTER — Other Ambulatory Visit (HOSPITAL_COMMUNITY)
Admission: RE | Admit: 2021-12-07 | Discharge: 2021-12-07 | Disposition: A | Discharge status: Home / Self Care | Payer: BC Managed Care – PPO | Source: Ambulatory Visit | Attending: Family Medicine | Admitting: Family Medicine

## 2021-12-07 ENCOUNTER — Encounter: Payer: Self-pay | Admitting: Family Medicine

## 2021-12-07 ENCOUNTER — Ambulatory Visit: Payer: BC Managed Care – PPO | Admitting: Family Medicine

## 2021-12-07 VITALS — BP 130/82 | HR 84 | Wt 182.0 lb

## 2021-12-07 DIAGNOSIS — Z113 Encounter for screening for infections with a predominantly sexual mode of transmission: Secondary | ICD-10-CM | POA: Insufficient documentation

## 2021-12-07 DIAGNOSIS — Z124 Encounter for screening for malignant neoplasm of cervix: Secondary | ICD-10-CM | POA: Diagnosis not present

## 2021-12-07 DIAGNOSIS — E119 Type 2 diabetes mellitus without complications: Secondary | ICD-10-CM | POA: Diagnosis not present

## 2021-12-07 NOTE — Assessment & Plan Note (Signed)
Tolerating ozempic thus far ?Has follow up in place with Dr. Raymondo Band ?Discussed I anticipate dose will be increased at that follow up pharmacy clinic visit ?

## 2021-12-07 NOTE — Patient Instructions (Signed)
It was great to see you again today! ? ?Pap today ?STI screening today ? ?Follow up with Dr. Valentina Lucks as scheduled ? ?Be well, ?Dr. Ardelia Mems ?

## 2021-12-07 NOTE — Progress Notes (Signed)
?  Date of Visit: 12/07/2021  ? ?SUBJECTIVE:  ? ?HPI: ? ?Marisa Davis presents today for pap smear. ? ?Diabetes - taking metformin XR 1000mg  daily, tolerating this better than the short acting version. Also taking ozempic 0.25mg  weekly, is on her second injection of that. Saw Dr. to initiate this, tolerating it well so far. Of note when she was seen for pharmacy visit had swelling of lips, was attributed possibly to the jardiance which she has stopped altogether. Pending referral to allergist. ? ?Pap smear - due today for pap. Desires STI testing, agreeable to throat and rectal swabs as well. Declines HIV & syphilis blood testing. ? ?OBJECTIVE:  ? ?BP 130/82   Pulse 84   Wt 182 lb (82.6 kg)   LMP 09/29/2014   SpO2 94%   BMI 29.38 kg/m?  ?Gen: no acute distress pleasant cooperative ?HEENT: normocephalic, atraumatic  ?Lungs: normal work of breathing  ?GU: normal appearing external genitalia without lesions. Vagina is moist without excessive discharge. Cervix normal in appearance. Chaperone present: 10/01/2014, CMA  ? ?ASSESSMENT/PLAN:  ? ?Health maintenance:  ?-pap smear done today along with routine STI testing, including rectal & throat swabs ? ?DM2 (diabetes mellitus, type 2) (HCC) ?Tolerating ozempic thus far ?Has follow up in place with Dr. Maree Erie ?Discussed I anticipate dose will be increased at that follow up pharmacy clinic visit ? ?FOLLOW UP: ?Follow up as scheduled in pharmacy clinic for diabetes  ? ?Raymondo Band J. [place], MD ?[facility] ?

## 2021-12-08 LAB — HM DIABETES EYE EXAM

## 2021-12-09 LAB — CERVICOVAGINAL ANCILLARY ONLY
Chlamydia: NEGATIVE
Chlamydia: NEGATIVE
Comment: NEGATIVE
Comment: NEGATIVE
Comment: NEGATIVE
Comment: NEGATIVE
Comment: NORMAL
Comment: NORMAL
Neisseria Gonorrhea: NEGATIVE
Neisseria Gonorrhea: NEGATIVE
Trichomonas: NEGATIVE
Trichomonas: NEGATIVE

## 2021-12-10 LAB — CYTOLOGY - PAP
Adequacy: ABSENT
Chlamydia: NEGATIVE
Comment: NEGATIVE
Comment: NEGATIVE
Comment: NEGATIVE
Comment: NORMAL
Diagnosis: NEGATIVE
Diagnosis: REACTIVE
High risk HPV: NEGATIVE
Neisseria Gonorrhea: NEGATIVE
Trichomonas: NEGATIVE

## 2021-12-17 ENCOUNTER — Encounter: Payer: Self-pay | Admitting: Family Medicine

## 2021-12-23 ENCOUNTER — Encounter: Payer: Self-pay | Admitting: Pharmacist

## 2021-12-23 ENCOUNTER — Ambulatory Visit: Payer: BC Managed Care – PPO | Admitting: Pharmacist

## 2021-12-23 DIAGNOSIS — E119 Type 2 diabetes mellitus without complications: Secondary | ICD-10-CM

## 2021-12-23 MED ORDER — OZEMPIC (0.25 OR 0.5 MG/DOSE) 2 MG/3ML ~~LOC~~ SOPN: 0.5000 mg | PEN_INJECTOR | SUBCUTANEOUS | 3 refills | Status: DC

## 2021-12-23 NOTE — Progress Notes (Signed)
    S:     Chief Complaint  Patient presents with   Medication Management    Diabetes - Ozempic follow-up   Marisa Davis is a 51 y.o. female who presents for diabetes evaluation, education, and management. PMH is significant for diabetes. Patient was referred and last seen by Primary Care Provider, Dr. Pollie Meyer, on 12/07/2021. At last pharmacist visit, patient was experiencing angioedema which has resolved (only lasted 2 days following visit).   Today, patient arrives in good spirits and presents without assistance.    Current diabetes medications include: Metformin 500mg  XR (taking 2 per day) and Ozempic 0.25mg  once weekly.  Current hypertension medications include: diltiazem 120mg  XR daily Current hyperlipidemia medications include: atorvastatin 20mg   Patient reports taking all medications as prescribed. Patient reports adherence with medications.   Do you feel that your medications are working for you? yes Have you been experiencing any side effects to the medications prescribed? Yes, reports of some constipation with use of Ozempic, which has been managed with stool softener.  Do you have any problems obtaining medications due to transportation or finances? no Insurance coverage: BCBS  Patient denies hypoglycemic events.  Reported home fasting blood sugars: Not checking    Patient denies nocturia (nighttime urination).  Sleeping un-interrupted.   O:  Physical Exam Constitutional:      Appearance: She is normal weight.  Pulmonary:     Effort: Pulmonary effort is normal.  Neurological:     Mental Status: She is alert.  Psychiatric:        Mood and Affect: Mood normal.        Behavior: Behavior normal.   Review of Systems  Gastrointestinal:  Positive for constipation.  All other systems reviewed and are negative.  Lab Results  Component Value Date   HGBA1C 8.4 (A) 11/23/2021   Vitals:   12/23/21 0923  BP: 114/72  Pulse: 72  SpO2: 100%    Lipid Panel      Component Value Date/Time   CHOL 126 11/23/2021 1022   TRIG 89 11/23/2021 1022   HDL 42 11/23/2021 1022   CHOLHDL 3.0 11/23/2021 1022   CHOLHDL 4.4 12/22/2015 1102   VLDL 15 12/22/2015 1102   LDLCALC 67 11/23/2021 1022   LDLDIRECT 163 (H) 11/12/2013 0931    A/P: Diabetes longstanding and currently much improved based on decreased nocturia and weight loss with use of Ozempic (semaglutide). Patient is able to verbalize appropriate hypoglycemia management plan. Medication adherence appears good.  -Increased dose of GLP-1 Ozempic (semaglutide) from 0.25mg  to 0.5mg  weekly.  -Continued metformin XR 1000mg  daily (2 X 500mg ). -Patient educated on purpose, proper use, and potential adverse effects of constipation and hypoglycemia.  Encouraged to continue taking stool softener during dose escalation.   -Extensively discussed pathophysiology of diabetes, recommended lifestyle interventions, dietary effects on blood sugar control.  -Next A1c anticipated July 2023  Overweight - discussed weight loss goal with target weight of 155-160 lbs.  We discussed gradual weight loss and target for any month is 2-4 pounds.   History of tobacco abuse - remains tobacco free.   Written patient instructions provided. Patient verbalized understanding of treatment plan. Total time in face to face counseling 28 minutes.    Follow up pharmacist in 1 month.  PCP clinic visit in July. Patient seen with 01/12/2014, PharmD, PGY2 Pharmacy Resident.  

## 2021-12-23 NOTE — Assessment & Plan Note (Signed)
Diabetes longstanding and currently much improved based on decreased nocturia and weight loss with use of Ozempic (semaglutide). Patient is able to verbalize appropriate hypoglycemia management plan. Medication adherence appears good.  -Increased dose of GLP-1 Ozempic (semaglutide) from 0.25mg  to 0.5mg  weekly.  -Continued metformin XR 1000mg  daily (2 X 500mg ). -Patient educated on purpose, proper use, and potential adverse effects of constipation and hypoglycemia.  Encouraged to continue taking stool softener during dose escalation.   -Extensively discussed pathophysiology of diabetes, recommended lifestyle interventions, dietary effects on blood sugar control.  -Next A1c anticipated July 2023

## 2021-12-23 NOTE — Patient Instructions (Addendum)
Great to see you again today.   Increase your Ozempic to 0.5mg  once weekly on Monday.   Please continue to take your stool softener daily.   Follow-up in 1 month.

## 2021-12-27 NOTE — Progress Notes (Signed)
Reviewed: I agree with Dr. Koval's documentation and management. 

## 2022-01-11 ENCOUNTER — Other Ambulatory Visit: Payer: Self-pay | Admitting: Family Medicine

## 2022-01-11 DIAGNOSIS — E7800 Pure hypercholesterolemia, unspecified: Secondary | ICD-10-CM

## 2022-01-11 DIAGNOSIS — R809 Proteinuria, unspecified: Secondary | ICD-10-CM

## 2022-01-27 ENCOUNTER — Encounter: Payer: Self-pay | Admitting: Pharmacist

## 2022-01-27 ENCOUNTER — Ambulatory Visit: Payer: BC Managed Care – PPO | Admitting: Pharmacist

## 2022-01-27 DIAGNOSIS — E119 Type 2 diabetes mellitus without complications: Secondary | ICD-10-CM | POA: Diagnosis not present

## 2022-01-27 MED ORDER — OZEMPIC (1 MG/DOSE) 4 MG/3ML ~~LOC~~ SOPN: 1.0000 mg | PEN_INJECTOR | SUBCUTANEOUS | 2 refills | Status: DC

## 2022-01-27 NOTE — Patient Instructions (Signed)
It was nice to see you today!  Your goal blood sugar is 80-130 before eating and less than 180 after eating.  Medication Changes: Increase Ozempic to 1 mg weekly. Next Monday, give two doses of 0.5 mg (equalling 1 mg total) to use up your current pen.   Continue metformin XR 1000 mg (2 tablets) daily.   Monitor blood sugars at home and keep a log (glucometer or piece of paper) to bring with you to your next visit.  Keep up the good work with diet and exercise. Aim for a diet full of vegetables, fruit and lean meats (chicken, [place], fish). Try to limit salt intake by eating fresh or frozen vegetables (instead of canned), rinse canned vegetables prior to cooking and do not add any additional salt to meals.

## 2022-01-27 NOTE — Progress Notes (Signed)
S:     Chief Complaint  Patient presents with   Medication Management    Diabetes follow up   Marisa Davis is a 51 y.o. female who presents for diabetes evaluation, education, and management. Patient was referred and last seen by Primary Care Provider, Dr. Pollie Meyer, on 12/07/21, and last by pharmacy clinic on 12/23/21 when Ozempic was increased.   Today, patient arrives in good spirits and presents without any assistance. She feels like Ozempic (semaglutide) isn't having any impact on her appetite yet but is improving her BG.   Current diabetes medications include: metformin XR 1000 mg daily, Ozempic (semaglutide) 0.5 mg weekly (Mondays)  Patient reports taking all medications as prescribed. Patient reports adherence with medications. Patient reports missing her medications 0 times per week, on average.  Have you been experiencing any side effects to the medications prescribed? no Do you have any problems obtaining medications due to transportation or finances? no Insurance coverage: BCBS  Patient denies hypoglycemic events.  Reported home fasting blood sugars: 113, 109, lowest 82, highest 169 Reported 2 hour post-meal/random blood sugars: does not check after eating  Patient denies nocturia (nighttime urination).  Patient denies neuropathy (nerve pain). Patient denies visual changes. Patient reports self foot exams.   Patient reported dietary habits: Eats 2 meals/day Breakfast: skips (sleeps until 12pm most days - works 2nd shift) Lunch: sandwich or cereal Dinner: chicken, starch, vegetables Snacks: snickers Drinks: water, regular soda once/day (Pepsi; had acid reflux with Diet sodas), little bit of apple or orange juice to take medicine  Within the past 12 months, did you worry whether your food would run out before you got money to buy more? no Within the past 12 months, did the food you bought run out, and you didn't have money to get more? no  Patient-reported  exercise habits: none currently. Goal is to start walking.   PHQ-9: 0  O:  Physical Exam Vitals reviewed.  Cardiovascular:     Rate and Rhythm: Normal rate.  Neurological:     Mental Status: She is alert.  Psychiatric:        Mood and Affect: Mood normal.        Behavior: Behavior normal.        Thought Content: Thought content normal.   Review of Systems  All other systems reviewed and are negative.  Lab Results  Component Value Date   HGBA1C 8.4 (A) 11/23/2021   Vitals:   01/27/22 0913  BP: 122/83  Pulse: 80  SpO2: 99%   Lipid Panel     Component Value Date/Time   CHOL 126 11/23/2021 1022   TRIG 89 11/23/2021 1022   HDL 42 11/23/2021 1022   CHOLHDL 3.0 11/23/2021 1022   CHOLHDL 4.4 12/22/2015 1102   VLDL 15 12/22/2015 1102   LDLCALC 67 11/23/2021 1022   LDLDIRECT 163 (H) 11/12/2013 0931    A/P: Diabetes longstanding currently uncontrolled based on last A1c but BG have since improved mostly to goal. Patient is able to verbalize appropriate hypoglycemia management plan. Medication adherence appears appropriate. Will continue GLP-1 RA dose titration.  -Increase Ozempic (semaglutide) to 1 mg weekly.  -Continue metformin XR 1000 mg (two 500 mg tablets) daily.  -Patient educated on purpose, proper use, and potential adverse effects of Ozempic.  -Extensively discussed pathophysiology of diabetes, recommended lifestyle interventions, dietary effects on blood sugar control.  -Counseled on s/sx of and management of hypoglycemia.  -Next A1c anticipated July 2023.   Written patient  instructions provided. Patient verbalized understanding of treatment plan. Total time in face to face counseling 32 minutes.    Follow up pharmacist clinic visit in 1 month. Patient seen with Jarome Matin, PharmD Candidate, and Pervis Hocking, PharmD, PGY2 Pharmacy Resident.

## 2022-01-27 NOTE — Assessment & Plan Note (Signed)
Diabetes longstanding currently uncontrolled based on last A1c but BG have since improved mostly to goal. Patient is able to verbalize appropriate hypoglycemia management plan. Medication adherence appears appropriate. Will continue GLP-1 RA dose titration.  -Increase Ozempic (semaglutide) to 1 mg weekly.  -Continue metformin XR 1000 mg (two 500 mg tablets) daily.

## 2022-01-27 NOTE — Progress Notes (Signed)
Reviewed: I agree with Dr. Koval's documentation and management. 

## 2022-02-16 ENCOUNTER — Other Ambulatory Visit: Payer: Self-pay | Admitting: Family Medicine

## 2022-02-19 ENCOUNTER — Other Ambulatory Visit: Payer: Self-pay | Admitting: Family Medicine

## 2022-03-03 ENCOUNTER — Ambulatory Visit: Payer: BC Managed Care – PPO | Admitting: Pharmacist

## 2022-03-03 DIAGNOSIS — E119 Type 2 diabetes mellitus without complications: Secondary | ICD-10-CM | POA: Diagnosis not present

## 2022-03-03 IMAGING — MG MM DIGITAL SCREENING BILAT W/ TOMO AND CAD
8 series · 9 of 24 positions shown · non-contrast
Comparison: Previous exam(s).

CLINICAL DATA: Screening.

EXAM:
DIGITAL SCREENING BILATERAL MAMMOGRAM WITH TOMOSYNTHESIS AND CAD
TECHNIQUE: Bilateral screening digital craniocaudal and mediolateral oblique
mammograms were obtained. Bilateral screening digital breast
tomosynthesis was performed. The images were evaluated with
computer-aided detection.

[R MLO synth-2D]
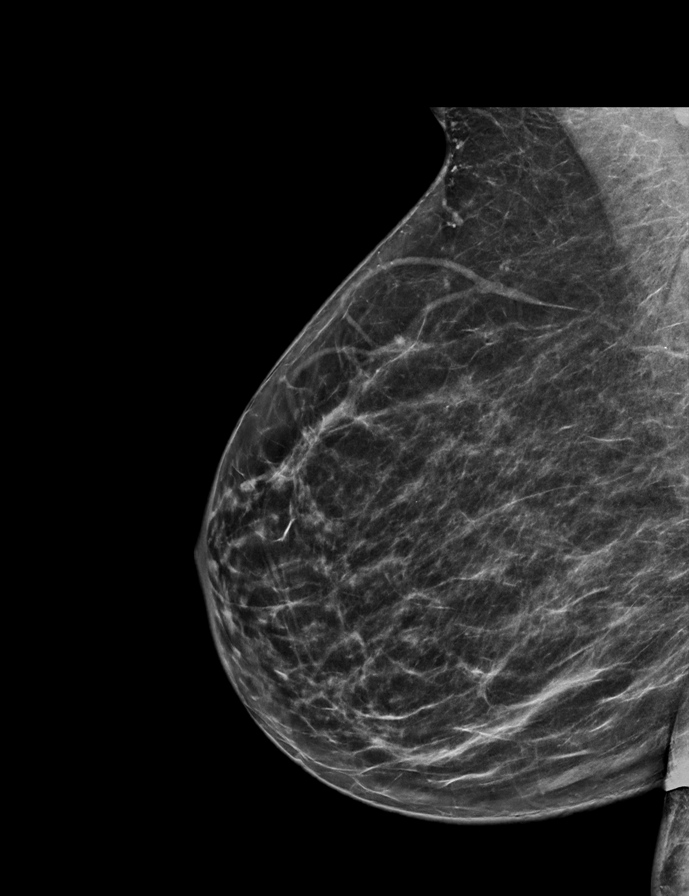

[L CC synth-2D]
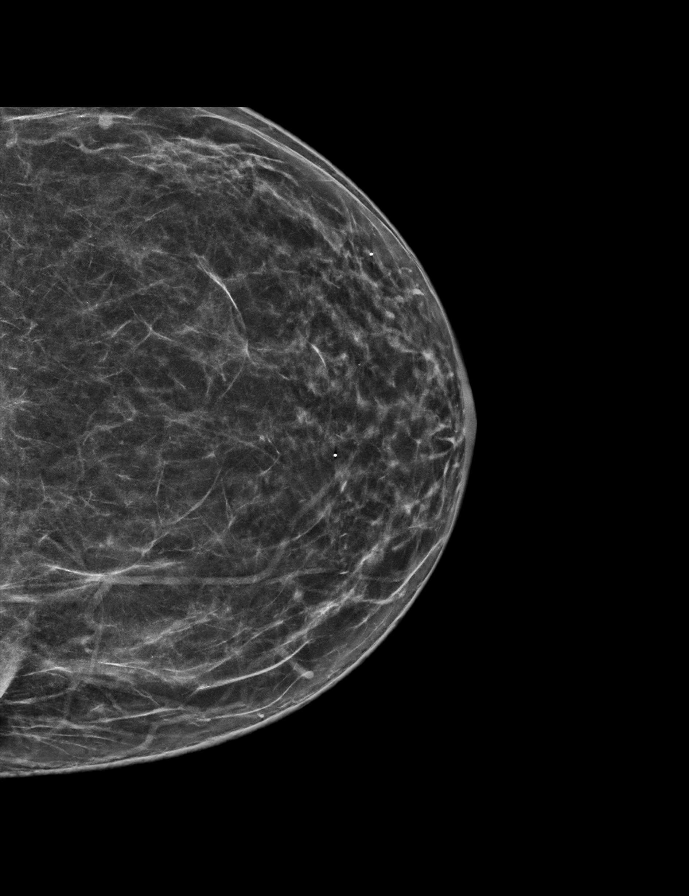

[R CC synth-2D]
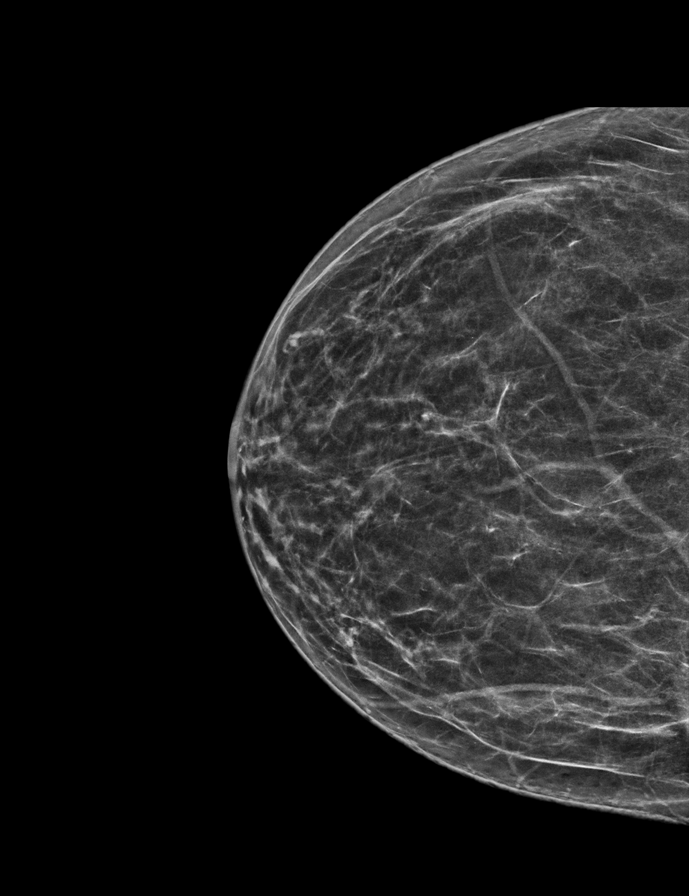

[L MLO synth-2D]
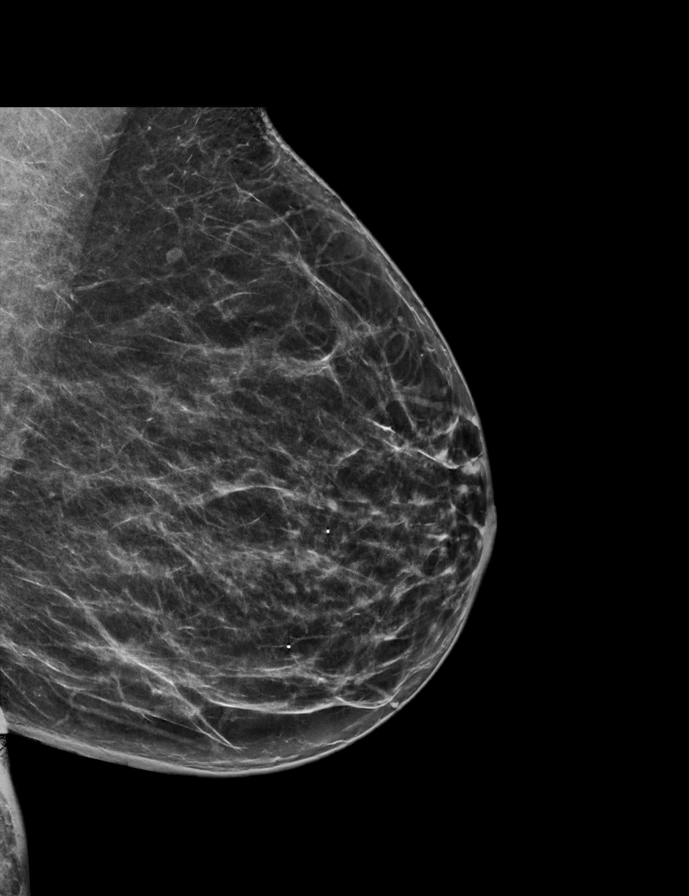

[R MLO tomo · 2 of 64 frames shown]
[frame 21/64]
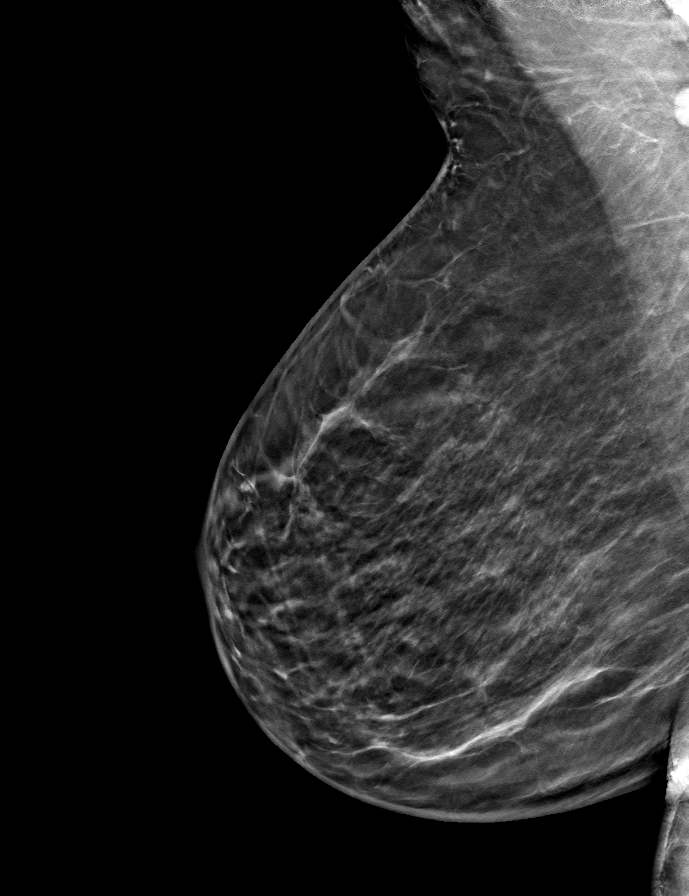
[frame 33/64]
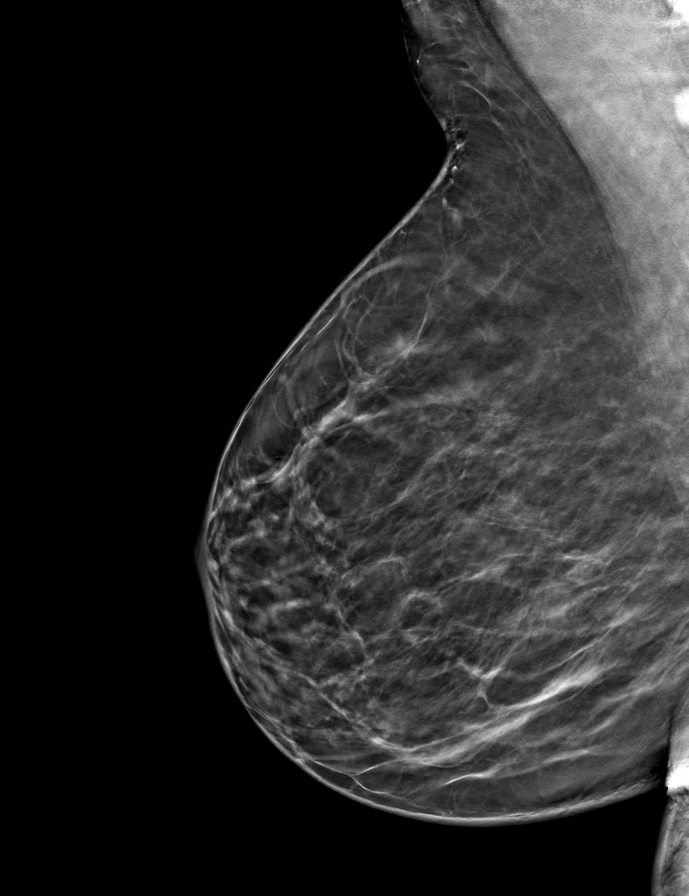

[L MLO tomo · tomo slice 33/64.0]
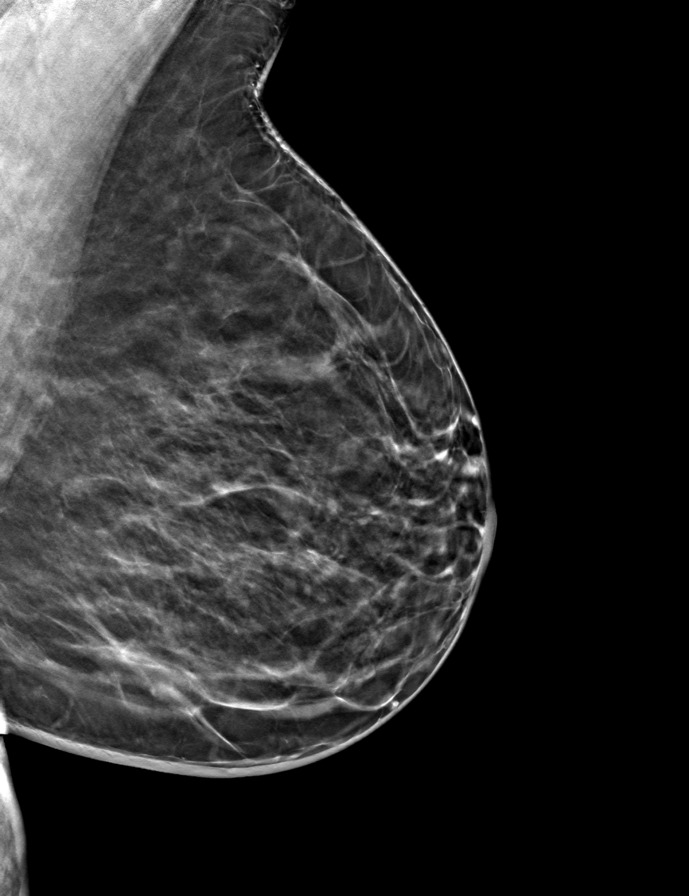

[L CC tomo · tomo slice 31/61.0]
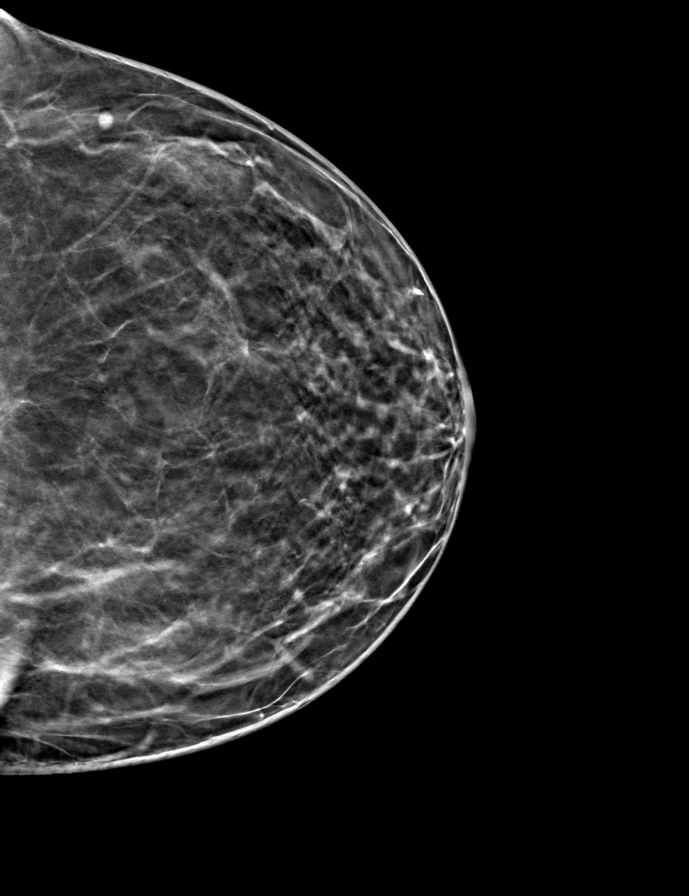

[R CC tomo · tomo slice 29/57.0]
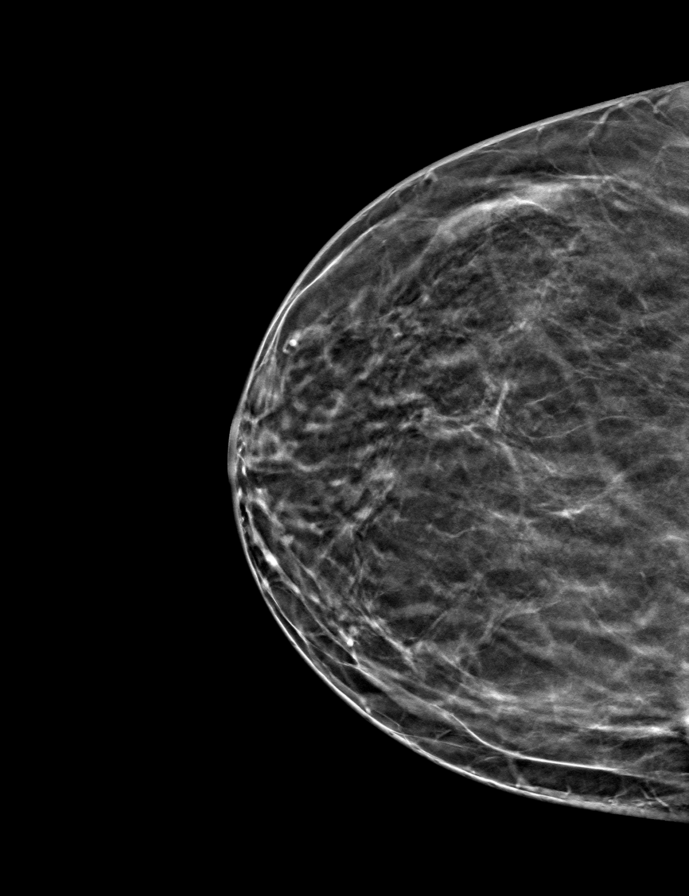

[9 of 24 positions shown; findings below may reference images not displayed]

ACR Breast Density Category b: There are scattered areas of
fibroglandular density.
FINDINGS: There are no findings suspicious for malignancy. The images were
evaluated with computer-aided detection.
IMPRESSION: No mammographic evidence of malignancy. A result letter of this
screening mammogram will be mailed directly to the patient.

RECOMMENDATION:
Screening mammogram in one year. (Code:WJ-I-BG6)

BI-RADS CATEGORY  1: Negative.

## 2022-03-03 MED ORDER — CETIRIZINE HCL 10 MG PO TABS: 10.0000 mg | ORAL_TABLET | Freq: Every day | ORAL | 1 refills | Status: DC

## 2022-03-03 MED ORDER — OZEMPIC (2 MG/DOSE) 8 MG/3ML ~~LOC~~ SOPN: PEN_INJECTOR | SUBCUTANEOUS | 6 refills | Status: DC

## 2022-03-03 NOTE — Progress Notes (Signed)
Reviewed: I agree with Dr. Koval's documentation and management. 

## 2022-03-03 NOTE — Progress Notes (Signed)
    S:     Chief Complaint  Patient presents with   Medication Management    Diabetes - Weight loss   Marisa Davis is a 51 y.o. female who presents for diabetes evaluation, education, and management.  Patient was last seen by Primary Care Provider, Dr. Pollie Meyer, on 12/07/2021.  She was seen in pharmacy clinic 01/27/2022. At last visit, Ozempic was titrated to 1mg  weekly. .   Today, patient arrives in  good spirits and presents without any assistance.    Current diabetes medications include: metformin 1000mg  once daily. And Ozempic 1mg  weekly. She requests refill for cetirizine.   Patient reports adherence to taking all medications as prescribed.   Patient denies adherence with medications,.  Do you feel that your medications are working for you? yes Have you been experiencing any side effects to the medications prescribed? no  Insurance coverage: BCBS  Patient denies hypoglycemic events. Reports reading of 79 without any side effects.  Discussed how use of metformin and semglutide are unlikely to cause try hypoglycemia.   Reported home fasting blood sugars: mix of 100s and 200s   Reports minimal appetite (satiety) change with higher dose of Ozempic (1mg ).  Mild nausea only for the first week, possibly second week with most recent dose increase.    O:   Review of Systems  All other systems reviewed and are negative.   Physical Exam Constitutional:      Appearance: Normal appearance.  Pulmonary:     Effort: Pulmonary effort is normal.  Neurological:     Mental Status: She is alert.  Psychiatric:        Mood and Affect: Mood normal.        Behavior: Behavior normal.        Thought Content: Thought content normal.     Lab Results  Component Value Date   HGBA1C 8.4 (A) 11/23/2021   Vitals:   03/03/22 0926  BP: 117/73  Pulse: 79  SpO2: 100%    Lipid Panel     Component Value Date/Time   CHOL 126 11/23/2021 1022   TRIG 89 11/23/2021 1022   HDL 42  11/23/2021 1022   CHOLHDL 3.0 11/23/2021 1022   CHOLHDL 4.4 12/22/2015 1102   VLDL 15 12/22/2015 1102   LDLCALC 67 11/23/2021 1022   LDLDIRECT 163 (H) 11/12/2013 0931   A/P: Diabetes longstanding and currently improved control with semaglutide and metformin combination therapy. Patient is able to verbalize appropriate hypoglycemia management plan. Medication adherence appears good. Patient desires more weight loss and would also like to lower blood sugars more. -Increased dose of GLP-1 Ozempic (semaglutide) from 1mg  to 2mg  minus 5 clicks.  -Continued metformin 1000mg  daily.   -Patient educated on purpose, proper use, and potential adverse effects of nausea with dose escalation.  -Extensively discussed pathophysiology of diabetes, recommended lifestyle interventions, dietary effects on blood sugar control.  -Counseled on s/sx of and management of hypoglycemia.   -Allergic rhinitis - controlled on cetirizine  Refilled cetirizine one daily.   Written patient instructions provided. Patient verbalized understanding of treatment plan.  Total time in face to face counseling 26 minutes.    Follow-up:  Pharmacist PRN - determine need for Rx clinic visit at next PCP visit in early September.  PCP clinic visit 04/14/2022.

## 2022-03-03 NOTE — Assessment & Plan Note (Addendum)
Diabetes longstanding and currently improved control with semaglutide and metformin combination therapy. Patient is able to verbalize appropriate hypoglycemia management plan. Medication adherence appears good. Patient desires more weight loss and would also like to lower blood sugars more. -Increased dose of GLP-1 Ozempic (semaglutide) from 1mg  to 2mg  minus 5 clicks.  -Continued metformin 1000mg  daily.   -Patient educated on purpose, proper use, and potential adverse effects of nausea with dose escalation.  -Extensively discussed pathophysiology of diabetes, recommended lifestyle interventions, dietary effects on blood sugar control.  -Counseled on s/sx of and management of hypoglycemia.

## 2022-03-03 NOTE — Patient Instructions (Signed)
It was nice to see you today!  Your goal blood sugar is 80-130 before eating and less than 180 after eating.  Medication Changes: Increase Ozempic to 2mg  minus 5 clicks  Continue Metformin same dose   Monitor blood sugars at home and keep a log (glucometer or piece of paper) to bring with you to your next visit.  Keep up the good work with diet and exercise. Aim for a diet full of vegetables, fruit and lean meats (chicken, , fish). Try to limit salt intake by eating fresh or frozen vegetables (instead of canned), rinse canned vegetables prior to cooking and do not add any additional salt to meals.

## 2022-03-07 ENCOUNTER — Other Ambulatory Visit: Payer: Self-pay | Admitting: Family Medicine

## 2022-04-05 ENCOUNTER — Other Ambulatory Visit: Payer: Self-pay | Admitting: Family Medicine

## 2022-04-05 MED ORDER — METFORMIN HCL ER 500 MG PO TB24: 1000.0000 mg | ORAL_TABLET | Freq: Every day | ORAL | 3 refills | Status: DC

## 2022-04-14 ENCOUNTER — Ambulatory Visit: Payer: BC Managed Care – PPO | Admitting: Family Medicine

## 2022-04-14 ENCOUNTER — Encounter: Payer: Self-pay | Admitting: Family Medicine

## 2022-04-14 ENCOUNTER — Other Ambulatory Visit: Payer: Self-pay

## 2022-04-14 VITALS — BP 132/81 | HR 86 | Wt 174.6 lb

## 2022-04-14 DIAGNOSIS — E119 Type 2 diabetes mellitus without complications: Secondary | ICD-10-CM

## 2022-04-14 LAB — POCT GLYCOSYLATED HEMOGLOBIN (HGB A1C): HbA1c, POC (controlled diabetic range): 10.6 % — AB (ref 0.0–7.0)

## 2022-04-14 MED ORDER — OZEMPIC (2 MG/DOSE) 8 MG/3ML ~~LOC~~ SOPN: PEN_INJECTOR | SUBCUTANEOUS | 6 refills | Status: DC

## 2022-04-14 NOTE — Assessment & Plan Note (Addendum)
Uncontrolled, A1C 10.6 today, up from 8.4 four months ago.  -Continue Ozempic 2mg  injection weekly, refilled today.  -Advised to increase Metformin XR to 1000mg  QD.  -Check fasting blood sugars and send mychart message with numbers.  -Encouraged lifestyle changes including walking 10-15 minutes per day, reducing intake of fried foods, juices, and sodas.  -Urine microalbumin today.

## 2022-04-14 NOTE — Patient Instructions (Signed)
It was great to see you again today!  Checking kidney function  Go up on metformin to 1000mg  daily (2 pills) Check sugars in the AM each day and send me a mychart message with your #s Call with any questions, or message  Follow up in 3 months, sooner if needed  Be well, Dr. 

## 2022-04-14 NOTE — Progress Notes (Signed)
  Date of Visit: 04/14/2022   HPI: Marisa Davis is a 50yo here for follow-up of diabetes:  Diabetes Patient is not checking her sugars because she doesn't like to be discouraged by high numbers. She is taking Metformin XR 500mg  one tab per day instead of 2 tabs per day as prescribed. In April she reports she was changed to the XR formulation. She is using Ozempic 2mg  injection without GI side effects currently. She lost approximately 8lbs. Her appetite is increased rather than decreased. She is eating three meals a day- 11am, 6/7pm, and 12am. She drinks orange juice, sodas, and water. She does not do physical activity.  PHYSICAL EXAM: BP 132/81   Pulse 86   Wt 174 lb 9.6 oz (79.2 kg)   LMP 09/29/2014   SpO2 100%   BMI 28.18 kg/m  Gen: Well appearing in no distress CV: Normal rate. No murmurs, rubs, or gallops. Normal S1 and S2. Normal rhythm. No pitting edema of BLE. Pedal pulses +2. Pulm: No increased work of breathing. No wheezing or crackles. Foot exam: No ulcers or sores of bilateral feet. Normal sensation bilaterally to monofilament.  ASSESSMENT/PLAN:  DM2 (diabetes mellitus, type 2) (HCC) Uncontrolled, A1C 10.6 today, up from 8.4 four months ago.  -Continue Ozempic 2mg  injection weekly, refilled today.  -Advised to increase Metformin XR to 1000mg  QD.  -Check fasting blood sugars and send mychart message with numbers.  -Encouraged lifestyle changes including walking 10-15 minutes per day, reducing intake of fried foods, juices, and sodas.  -Urine microalbumin today.  Health Maintenance -Patient declines flu vaccine -Reminded patient to get 2nd Shingles vaccine -Normal foot exam today -urine microalbumin today  Follow-up Return in 3 months for diabetes follow-up.   , Medical Student [facility]  Patient seen along with MS3 student . I personally evaluated this patient along with the student, and verified all aspects of the  history, physical exam, and medical decision making as documented by the student. I agree with the student's documentation and have made all necessary edits.  , MD  [facility]

## 2022-04-15 LAB — MICROALBUMIN / CREATININE URINE RATIO
Creatinine, Urine: 152.6 mg/dL
Microalb/Creat Ratio: 189 mg/g creat — ABNORMAL HIGH (ref 0–29)
Microalbumin, Urine: 287.8 ug/mL

## 2022-04-18 ENCOUNTER — Encounter: Payer: Self-pay | Admitting: Family Medicine

## 2022-04-22 ENCOUNTER — Encounter: Payer: Self-pay | Admitting: Family Medicine

## 2022-05-02 ENCOUNTER — Other Ambulatory Visit: Payer: Self-pay | Admitting: Family Medicine

## 2022-05-02 DIAGNOSIS — Z1231 Encounter for screening mammogram for malignant neoplasm of breast: Secondary | ICD-10-CM

## 2022-05-11 ENCOUNTER — Encounter: Payer: Self-pay | Admitting: Family Medicine

## 2022-05-11 DIAGNOSIS — E119 Type 2 diabetes mellitus without complications: Secondary | ICD-10-CM

## 2022-05-13 MED ORDER — OZEMPIC (2 MG/DOSE) 8 MG/3ML ~~LOC~~ SOPN: PEN_INJECTOR | SUBCUTANEOUS | 6 refills | Status: DC

## 2022-05-31 ENCOUNTER — Other Ambulatory Visit: Payer: Self-pay | Admitting: Family Medicine

## 2022-06-03 ENCOUNTER — Ambulatory Visit
Admission: RE | Admit: 2022-06-03 | Discharge: 2022-06-03 | Disposition: A | Discharge status: Home / Self Care | Payer: BC Managed Care – PPO | Source: Ambulatory Visit | Attending: Family Medicine | Admitting: Family Medicine

## 2022-06-03 DIAGNOSIS — Z1231 Encounter for screening mammogram for malignant neoplasm of breast: Secondary | ICD-10-CM | POA: Diagnosis not present

## 2022-06-03 MED ORDER — METFORMIN HCL ER 500 MG PO TB24: 1000.0000 mg | ORAL_TABLET | Freq: Every day | ORAL | 3 refills | Status: DC

## 2022-06-07 ENCOUNTER — Other Ambulatory Visit: Payer: Self-pay | Admitting: Family Medicine

## 2022-06-07 DIAGNOSIS — R928 Other abnormal and inconclusive findings on diagnostic imaging of breast: Secondary | ICD-10-CM

## 2022-06-20 ENCOUNTER — Ambulatory Visit
Admission: RE | Admit: 2022-06-20 | Discharge: 2022-06-20 | Disposition: A | Discharge status: Home / Self Care | Payer: BC Managed Care – PPO | Source: Ambulatory Visit | Attending: Family Medicine | Admitting: Family Medicine

## 2022-06-20 ENCOUNTER — Ambulatory Visit: Payer: BC Managed Care – PPO

## 2022-06-20 DIAGNOSIS — R92321 Mammographic fibroglandular density, right breast: Secondary | ICD-10-CM | POA: Diagnosis not present

## 2022-06-20 DIAGNOSIS — R928 Other abnormal and inconclusive findings on diagnostic imaging of breast: Secondary | ICD-10-CM

## 2022-07-03 ENCOUNTER — Other Ambulatory Visit: Payer: Self-pay | Admitting: Family Medicine

## 2022-07-05 MED ORDER — METFORMIN HCL ER 500 MG PO TB24: 1000.0000 mg | ORAL_TABLET | Freq: Every day | ORAL | 3 refills | Status: DC

## 2022-07-06 MED ORDER — OZEMPIC (2 MG/DOSE) 8 MG/3ML ~~LOC~~ SOPN: PEN_INJECTOR | SUBCUTANEOUS | 6 refills | Status: DC

## 2022-07-06 NOTE — Addendum Note (Signed)
Addended by: Latrelle Dodrill on: 07/06/2022 02:07 PM   Modules accepted: Orders

## 2022-09-23 DIAGNOSIS — H2513 Age-related nuclear cataract, bilateral: Secondary | ICD-10-CM | POA: Diagnosis not present

## 2022-09-23 DIAGNOSIS — H35033 Hypertensive retinopathy, bilateral: Secondary | ICD-10-CM | POA: Diagnosis not present

## 2022-09-23 DIAGNOSIS — H524 Presbyopia: Secondary | ICD-10-CM | POA: Diagnosis not present

## 2022-09-23 DIAGNOSIS — E113293 Type 2 diabetes mellitus with mild nonproliferative diabetic retinopathy without macular edema, bilateral: Secondary | ICD-10-CM | POA: Diagnosis not present

## 2022-10-13 ENCOUNTER — Telehealth: Payer: Self-pay

## 2022-10-13 NOTE — Telephone Encounter (Signed)
LIBERATE Study  Received referral for patient participation in the LIBERATE CGM Study. Contacted patient to discuss study and confirmed HIPAA identifiers. Confirmed patient was provided the LIBERATE Study Information Sheet and any questions were answered.   Confirmed that patient meets study criteria by having a diagnosis of Type 2 Diabetes, is not currently on insulin, and most recent A1c is >8%.  Patient provided verbal consent to participate in the study. Consent documented in electronic medical record.   '@CGMFLO'$ @  - Confirmed that patient has a compatible smart phone to [employer] 3 app. ([email or web address]) - Asked to download and create a [employer] account prior to first study visit.  - Scheduled first study visit on 10/19/2022. Confirmed patient has transportation to this appointment.  - Discussed use of MyChart in this study. Confirmed patient has an active MyChart account and is aware of their log in information.  Patient aware of pre-visit questionnaire that will be sent 2 days prior to their scheduled study visit and they will plan to complete before the visit.   Joseph Art, Pharm.D. PGY-2 Ambulatory Care Pharmacy Resident

## 2022-10-19 ENCOUNTER — Other Ambulatory Visit (HOSPITAL_COMMUNITY): Payer: Self-pay

## 2022-10-19 ENCOUNTER — Encounter (INDEPENDENT_AMBULATORY_CARE_PROVIDER_SITE_OTHER): Payer: BC Managed Care – PPO | Admitting: Pharmacist

## 2022-10-19 ENCOUNTER — Encounter: Payer: Self-pay | Admitting: Pharmacist

## 2022-10-19 VITALS — BP 133/80 | HR 86 | Wt 175.0 lb

## 2022-10-19 DIAGNOSIS — E119 Type 2 diabetes mellitus without complications: Secondary | ICD-10-CM

## 2022-10-19 LAB — POCT GLYCOSYLATED HEMOGLOBIN (HGB A1C): HbA1c, POC (controlled diabetic range): 9.3 % — AB (ref 0.0–7.0)

## 2022-10-19 NOTE — Patient Instructions (Addendum)
Sensor Application If using the App, you can tap Help in the Main Menu to access an in-app tutorial on applying a Sensor. See below for instructions on how to download the app. Apply Sensors only on the back of your upper arm. If placed in other areas, the Sensor may not function properly and could give you inaccurate readings. Avoid areas with scars, moles, stretch marks, or lumps.   Select an area of skin that generally stays flat during your normal daily activities (no bending or folding). Choose a site that is at least 1 inch (2.5 cm) away from any injection sites. To prevent discomfort or skin irritation, you should select a different site other than the one most recently used. Wash application site using a plain soap, dry, and then clean with an alcohol wipe. This will help remove any oily residue that may prevent the sensor from sticking properly. Allow site to air dry before proceeding. Note: The area MUST be clean and dry, or the Sensor may not stay on for the full wear duration specified by your Sensor insert. 4. Unscrew the cap from the Sensor Applicator and set the cap aside.  5. Place the Sensor Applicator over the prepared site and push down firmly to apply the Sensor to your body. 6. Gently pull the Sensor Applicator away from your body. The Sensor should now be attached to your skin. 7. Make sure the Sensor is secure after application. Put the cap back on the Sensor Applicator. Discard the used Engineer, civil (consulting) according to local regulations.  What If My Sensor Falls Off or What If My Sensor Isn't Working? Call [place] Team at [phone] Available 7 days a week from 8AM-8PM EST, excluding holidays If yo have multiple sensors fall off prior to 14 days of use, contact [employer] at [phone]   The App Download the [employer] 3 App in your phone's app store   Load the app and select get started now Create an account  Tap scan new  sensor Follow the prompts on the screen. If your sensor does not sync, try moving your phone slowly around the sensor. Phone cases may affect scanning. This will be the only time you have to scan the sensor until you apply a new sensor in 14 days.  There will be a 60 minute start up period until the app will display your glucose reading

## 2022-10-19 NOTE — Research (Signed)
S:     Chief Complaint  Patient presents with   Medication Management    LIBERATE enrollment   52 y.o. female who presents for diabetes evaluation, education, and management and potential enrollment in the LIBERATE Study.   PMH is significant for tobacco use, hyperlipidemia. Patient was referred and last seen by Primary Care Provider, Dr. Ardelia Mems, on 04/14/22.   At last visit, A1c was 10.6% and dose of metformin was increased to 1000 mg daily.   Today, patient arrives in good spirits and presents without any assistance.  Patient reports Diabetes was diagnosed in 2016.   Current diabetes medications include: Ozempic (semaglutide) 2 mg once weekly, metformin 1000 mg XR twice daily,  Current hypertension medications include: diltiazem XR 120 mg once daily Current hyperlipidemia medications include: atorvastatin 20 mg daily  Patient reports adherence to taking all medications as prescribed. States she had an allergic reaction (angioedema) to lisinopril but does not believe she has ever had a reaction to Jardiance (empagliflozin) despite it being documented as a hypersensitivity (itching) in her chart.  Insurance coverage: [place]  Patient denies nocturia (nighttime urination) Patient denies neuropathy (nerve pain) Patient denies visual changes  Reported dietary habits: Drinks: "coke" and juice  O:   Review of Systems  All other systems reviewed and are negative.   Physical Exam Constitutional:      Appearance: Normal appearance.  Pulmonary:     Effort: Pulmonary effort is normal.  Neurological:     Mental Status: She is alert.  Psychiatric:        Mood and Affect: Mood normal.        Behavior: Behavior normal.        Thought Content: Thought content normal.     Lab Results  Component Value Date   HGBA1C 9.3 (A) 10/19/2022   POC A1c Today: 9.3%  Vitals:   10/19/22 0908  BP: 133/80  Pulse: 86  SpO2: 100%     Lipid Panel     Component Value Date/Time    CHOL 126 11/23/2021 1022   TRIG 89 11/23/2021 1022   HDL 42 11/23/2021 1022   CHOLHDL 3.0 11/23/2021 1022   CHOLHDL 4.4 12/22/2015 1102   VLDL 15 12/22/2015 1102   LDLCALC 67 11/23/2021 1022   LDLDIRECT 163 (H) 11/12/2013 0931   Clinical Atherosclerotic Cardiovascular Disease (ASCVD): No   A/P: LIBERATE Study:  -Patient provided verbal consent to participate in the study. Consent documented in electronic medical record.  -Provided education on Libre 3 CGM. Collaborated to ensure [employer] 3 app was downloaded on patient's phone. Educated on how to place sensor every 14 days, patient placed first sensor correctly and verbalized understanding of use, removal, and how to place next sensor. Discussed alarms. 8 sensors provided for a 3 month supply. Educated to contact the office if the sensor falls off early and replacements are needed before their next [employer].  Diabetes diagnosed in 2016, currently uncontrolled with an A1c of 9.3%. Patient is able to verbalize appropriate hypoglycemia management plan. Patient reports being adherent to medications. Control is suboptimal due to dietary indiscretions and not regularly checking blood sugar. Discussed the possibility of starting insulin and patient was hesitant and stated she wanted to avoid starting insulin if at all possible. -Continued GLP-1 Ozempic (semaglutide) 2 mg once weekly  -Continued metformin XR 1000 mg twice daily Consider potential rechallenge with SGLT2 or possibly insulin therapy at next visit.  -Patient educated on purpose, proper use, and potential adverse  effects  -Extensively discussed pathophysiology of diabetes, recommended lifestyle interventions, dietary effects on blood sugar control.  -Counseled on s/sx of and management of hypoglycemia.  -Next A1c anticipated 01/2023.   Written patient instructions provided. Patient verbalized understanding of treatment plan.  Total time in face to face counseling 30 minutes.     Follow-up:  Pharmacist on 11/02/22. Patient seen with Louanne Belton PharmD PGY-1 Pharmacy Resident and Estelle June, PharmD Candidate.

## 2022-10-19 NOTE — Assessment & Plan Note (Signed)
LIBERATE Study:  -Patient provided verbal consent to participate in the study. Consent documented in electronic medical record.  -Provided education on Libre 3 CGM. Collaborated to ensure El[employer] 3 app was downloaded on patient's phone. Educated on how to place sensor every 14 days, patient placed first sensor correctly and verbalized understanding of use, removal, and how to place next sensor. Discussed alarms. 8 sensors provided for a 3 month supply. Educated to contact the office if the sensor falls off early and replacements are needed before their next [employer].  Diabetes diagnosed in 2016, currently uncontrolled with an A1c of 9.3%. Patient is able to verbalize appropriate hypoglycemia management plan. Patient reports being adherent to medications. Control is suboptimal due to dietary indiscretions and not regularly checking blood sugar. Discussed the possibility of starting insulin and patient was hesitant and stated she wanted to avoid starting insulin if at all possible. -Continued GLP-1 Ozempic (semaglutide) 2 mg once weekly  -Continued metformin XR 1000 mg twice daily Consider potential rechallenge with SGLT2 or possibly insulin therapy at next visit.  -Patient educated on purpose, proper use, and potential adverse effects  -Extensively discussed pathophysiology of diabetes, recommended lifestyle interventions, dietary effects on blood sugar control.  -Counseled on s/sx of and management of hypoglycemia.  -Next A1c anticipated 01/2023.

## 2022-10-19 NOTE — Progress Notes (Signed)
Reviewed and agree with Dr Koval's plan.   

## 2022-10-26 ENCOUNTER — Telehealth: Payer: Self-pay | Admitting: Pharmacist

## 2022-10-26 NOTE — Telephone Encounter (Signed)
LIBERATE Study  Patient completed first study visit for the LIBERATE CGM Study. Contacted patient to discuss CGM tolerability.   Patient unavailable -requested call back.

## 2022-10-26 NOTE — Telephone Encounter (Signed)
Reviewed and agree with Dr Koval's plan.   

## 2022-10-26 NOTE — Telephone Encounter (Signed)
Patient returns call to nurse line. She states that she is fixing to be at work and cannot receive call back today.   She wanted me to let Dr. Valentina Lucks know that the CGM is working great and she is not having any problems.   Talbot Grumbling, RN

## 2022-11-02 ENCOUNTER — Ambulatory Visit (INDEPENDENT_AMBULATORY_CARE_PROVIDER_SITE_OTHER): Payer: BC Managed Care – PPO | Admitting: Pharmacist

## 2022-11-02 ENCOUNTER — Encounter: Payer: Self-pay | Admitting: Pharmacist

## 2022-11-02 VITALS — BP 117/74 | HR 71 | Wt 177.2 lb

## 2022-11-02 DIAGNOSIS — E119 Type 2 diabetes mellitus without complications: Secondary | ICD-10-CM | POA: Diagnosis not present

## 2022-11-02 MED ORDER — DAPAGLIFLOZIN PROPANEDIOL 10 MG PO TABS: 10.0000 mg | ORAL_TABLET | Freq: Every day | ORAL | 3 refills | Status: DC

## 2022-11-02 NOTE — Assessment & Plan Note (Signed)
Diabetes diagnosed in 2016. Libre report analyzed. Patient is able to verbalize appropriate hypoglycemia management plan. Diabetes medication adherence good except for Ozempic since patient could not afford it. Control is suboptimal due to elevated glucose readings from CGM during early afternoon to evening on most days. -Discontinued GLP-1 Ozempic (semaglutide) 0.25 mg weekly due to cost concerns. -Started SGLT2-I Farxiga (dapagliflozin) 10 mg daily. Patient educated on purpose, proper use and potential adverse effects including UTI and yeast infection. Counseled on sick day rules for [place]. Following instruction patient verbalized understanding of treatment plan. -Continued metformin 1000 mg BID -Stopped glipizide 2.5 mg daily -Extensively discussed pathophysiology of diabetes, recommended lifestyle interventions, dietary effects on blood sugar control -Counseled on s/sx of and management of hypoglycemia -Next A1C anticipated 01/19/23.

## 2022-11-02 NOTE — Progress Notes (Signed)
S:     PMH significant for diabetes.   Patient presents today for diabetes management and Libre 2.0 application. Patient was referred and last seen by Primary Care Provider [facility]. Reports noticing how much her sugars change in response to her diet, like cereal, soda, sweet tea, and life savers. States she is no longer taking Ozempic (semaglutide) due to being unable to afford it. As a result, she started taking glipizide 2.5 mg daily on 10/24/22. Since restarting her glipizide, patient reports seeing 2-3 blood sugar levels < 70 mg/dL.  Insurance coverage/medication affordability: BCBS  Patient reports diabetes was diagnosed in 2016.   Patient denies hypoglycemic events. Has had a few readings <70 mg/dL but denies symptoms.  Patient reports adherence with medications.  Current diabetes medications include: glipizide 2.5 mg XR once daily, metformin 1000 mg twice daily   Patient denies neuropathy (nerve pain). Patient denies visual changes.  Freestyle Libre 3.0 patient education Patient taking >500 mg Vitamin C: No Reminded patient to not take Vitamin C with Freestyle Libre.  Instruction: [employer] 14-day Freestyle Libre patient education  CGM overview and set-up 1. Button, touch screen, and icons 2. Power supply and recharging 3. Home screen 4. Date and time 5. Set BG target range: 80-250 mg/dL 6. Set alarm/alert tone  7. Interstitial vs. capillary blood glucose readings  8. When to verify sensor reading with fingerstick blood glucose  Sensor application 1. Site selection and site prep with alcohol pad/Skin Tac 2. Sensor prep-sensor pack and sensor applicator 3. Starting the sensor: 1 hour warm up before BG readings available     Will ask for fingersticks the first 12 hours   4. Sensor change every 14 days and rotate site 5. Call [employer] customer service if sensor comes off before 14 days  Safety and Troubleshooting 1. Scan the sensor when a new on is applied. 2. When the  "test BG" symbol appears, test fingerstick blood glucose prior to    making treatment decisions 3. Do a fingerstick blood glucose test if the sensor readings do not match how    you feel 4. Remove sensor prior for MRI or CT. Sensor may be damaged by exposure to    airport x-ray screening 5. Vitamin C may cause false high readings and aspirin may cause false low     readings 6. Store sensor kit between 39 and 77 degrees. Can be refrigerated at this temp.  Contact information provided for [employer] customer service and/or trainer.  O:  CGM report from last 14 days  Average Glucose 146 mg/dL  Glucose Meter Index (GMI) 6.8%  Glucose Variability 39.4%   Time in Range (TIR) %  Very High 6%  High 19%  Target 74%  Low 1%  Very Low 0%   Labs:   Vitals:   11/02/22 0929  BP: 117/74  Pulse: 71  SpO2: 100%    Lab Results  Component Value Date   HGBA1C 9.3 (A) 10/19/2022   HGBA1C 10.6 (A) 04/14/2022   HGBA1C 8.4 (A) 11/23/2021       Component Value Date/Time   CHOL 126 11/23/2021 1022   TRIG 89 11/23/2021 1022   HDL 42 11/23/2021 1022   CHOLHDL 3.0 11/23/2021 1022   CHOLHDL 4.4 12/22/2015 1102   VLDL 15 12/22/2015 1102   LDLCALC 67 11/23/2021 1022   LDLDIRECT 163 (H) 11/12/2013 0931    Lab Results  Component Value Date   MICRALBCREAT 189 (H) 04/14/2022    Assessment:  Diabetes  Diabetes diagnosed in 2016. Libre report analyzed. Patient is able to verbalize appropriate hypoglycemia management plan. Diabetes medication adherence good except for Ozempic since patient could not afford it. Control is suboptimal due to elevated glucose readings from CGM during early afternoon to evening on most days. -Discontinued GLP-1 Ozempic (semaglutide) 0.25 mg weekly due to cost concerns. -Started SGLT2-I Farxiga (dapagliflozin) 10 mg daily. Patient educated on purpose, proper use and potential adverse effects including UTI and yeast infection. Counseled on sick day rules for [place].  Following instruction patient verbalized understanding of treatment plan. -Continued metformin 1000 mg BID -Stopped glipizide 2.5 mg daily -Extensively discussed pathophysiology of diabetes, recommended lifestyle interventions, dietary effects on blood sugar control -Counseled on s/sx of and management of hypoglycemia -Next A1C anticipated 01/19/23.   This appointment required 25 minutes of patient care (this includes precharting, chart review, review of results, face-to-face care, etc.).  Thank you for involving pharmacy to assist in providing this patient's care.  Patient seen with Louanne Belton PharmD PGY-1 Pharmacy Resident and Estelle June, PharmD Candidate.

## 2022-11-02 NOTE — Patient Instructions (Addendum)
It was nice to see you today!  Your goal blood sugar is 80-130 before eating and less than 180 after eating.  Medication Changes: START Farxiga (dapagliflozin) 10 mg once daily STOP glipizide 2.5 mg  CONTINUE metformin 1000 mg twice daily  Monitor blood sugars at home and keep a log (glucometer or piece of paper) to bring with you to your next visit.  Keep up the good work with diet and exercise. Aim for a diet full of vegetables, fruit and lean meats (chicken, [place], fish). Try to limit salt intake by eating fresh or frozen vegetables (instead of canned), rinse canned vegetables prior to cooking and do not add any additional salt to meals.     [facility] [place] [address]

## 2022-11-02 NOTE — Progress Notes (Signed)
Reviewed and agree with Dr Koval's plan.   

## 2022-11-03 NOTE — Progress Notes (Signed)
Reviewed and agree with Dr Koval's plan.   

## 2022-11-22 ENCOUNTER — Ambulatory Visit (INDEPENDENT_AMBULATORY_CARE_PROVIDER_SITE_OTHER): Payer: BC Managed Care – PPO | Admitting: Pharmacist

## 2022-11-22 ENCOUNTER — Encounter: Payer: Self-pay | Admitting: Pharmacist

## 2022-11-22 VITALS — BP 140/81 | HR 97 | Wt 176.8 lb

## 2022-11-22 DIAGNOSIS — Z87891 Personal history of nicotine dependence: Secondary | ICD-10-CM

## 2022-11-22 DIAGNOSIS — E119 Type 2 diabetes mellitus without complications: Secondary | ICD-10-CM | POA: Diagnosis not present

## 2022-11-22 NOTE — Assessment & Plan Note (Signed)
Encouraged to continue with abstinence from smoking - doing well.

## 2022-11-22 NOTE — Assessment & Plan Note (Signed)
Diabetes longstanding currently improving. Patient is able to verbalize appropriate hypoglycemia management plan. Medication adherence appears good. Control is optimal due to GMI of 7.3% and average glucose of 168 mg/dL captured from CGM data. -Continued SGLT2-I Farxiga (dapagliflozin)10 mg daily. Counseled on sick day rules. -Continued metformin 1000 mg BID.  -Extensively discussed pathophysiology of diabetes, recommended lifestyle interventions, dietary effects on blood sugar control.  -Counseled on s/sx of and management of hypoglycemia.  -Next A1c anticipated 01/19/23.

## 2022-11-22 NOTE — Patient Instructions (Addendum)
It was great seeing you today!  We have no changes to your medications today. Keep up the great work!

## 2022-11-22 NOTE — Progress Notes (Signed)
S:     Chief Complaint  Patient presents with   Medication Management    T2DM   52 y.o. female who presents for diabetes evaluation, education, and management.  PMH is significant for T2DM.  Patient was referred and last seen by Primary Care Provider, Dr. Pollie Meyer, on 04/2022.  At last visit, Ozempic was discontinued due to cost, glipizide was discontinued, and Farxiga (dapagliflozin) 10 mg daily was initiated.   Today, patient arrives in good spirits and presents without any assistance.  Patient reports Diabetes was diagnosed in.   Current diabetes medications include: Farxiga (dapagliflozin) 10 mg daily, metformin 1000 mg BID  Patient reports adherence to taking all medications as prescribed.   Do you feel that your medications are working for you? yes Have you been experiencing any side effects to the medications prescribed? no Do you have any problems obtaining medications due to transportation or finances? no Insurance coverage: [employer]  Patient reports hypoglycemic events. CGM reports 4 instances of lows over the past 2 weeks, patient states she may have felt low symptoms once, but generally does not feel symptoms when she is low  Patient denies nocturia (nighttime urination).  Patient denies neuropathy (nerve pain). Patient denies visual changes. Patient denies self foot exams.   Within the past 12 months, did you worry whether your food would run out before you got money to buy more? no Within the past 12 months, did the food you bought run out, and you didn't have money to get more? no  O:   Review of Systems  All other systems reviewed and are negative.   Physical Exam Constitutional:      Appearance: Normal appearance.  Neurological:     Mental Status: She is alert.  Psychiatric:        Mood and Affect: Mood normal.        Behavior: Behavior normal.        Thought Content: Thought content normal.        Judgment: Judgment normal.      Freestyle Libre 3 CGM Download:  % Time CGM is active: 96% Average Glucose: 168 mg/dL Glucose Management Indicator: 7.3  Glucose Variability: 35.0% (goal <36%) Time in Goal:  - Time in range 70-180: 60% - Time above range: 39% (31% High 181-250 mg/dL, 8% Very High >161 mg/dL) - Time below range: 1% (Low 54-69 mg/dL)   Lab Results  Component Value Date   HGBA1C 9.3 (A) 10/19/2022   Vitals:   11/22/22 1612  BP: (!) 143/90  Pulse: 97  SpO2: 99%    Lipid Panel     Component Value Date/Time   CHOL 126 11/23/2021 1022   TRIG 89 11/23/2021 1022   HDL 42 11/23/2021 1022   CHOLHDL 3.0 11/23/2021 1022   CHOLHDL 4.4 12/22/2015 1102   VLDL 15 12/22/2015 1102   LDLCALC 67 11/23/2021 1022   LDLDIRECT 163 (H) 11/12/2013 0931    Clinical Atherosclerotic Cardiovascular Disease (ASCVD): No  The ASCVD Risk score (Arnett DK, et al., 2019) failed to calculate for the following reasons:   The valid total cholesterol range is 130 to 320 mg/dL   A/P: Diabetes longstanding currently improving. Patient is able to verbalize appropriate hypoglycemia management plan. Medication adherence appears good. Control is optimal due to GMI of 7.3% and average glucose of 168 mg/dL captured from CGM data. -Continued SGLT2-I Farxiga (dapagliflozin)10 mg daily. Counseled on sick day rules. -Continued metformin 1000 mg BID.  -Extensively  discussed pathophysiology of diabetes, recommended lifestyle interventions, dietary effects on blood sugar control.  -Counseled on s/sx of and management of hypoglycemia.  -Next A1c anticipated 01/19/23.   History of proteinuria.  UACR 189  on 04/14/2022 - Continue Diltiazem  at this time (this was started for proteinuria at the time of her angioedema with lisinopril).  - Continue Farxiga (dapagliflozin) 10 mg daily.  - In future, if blood pressure remains > 130/80 OR if proteinuria worsens, consider addition of eplerenone  as next agent.   Written patient  instructions provided. Patient verbalized understanding of treatment plan.  Total time in face to face counseling 25 minutes.    Follow-up:  Pharmacist 01/2023. Patient seen with Jerry Caras, PharmD PGY-1 Pharmacy Resident and Revonda Standard, PharmD Candidate.

## 2022-11-24 NOTE — Progress Notes (Signed)
Reviewed and agree with Dr Koval's plan.   

## 2022-11-29 ENCOUNTER — Other Ambulatory Visit: Payer: Self-pay

## 2022-11-29 MED ORDER — CETIRIZINE HCL 10 MG PO TABS: 10.0000 mg | ORAL_TABLET | Freq: Every day | ORAL | 1 refills | Status: DC

## 2023-01-12 ENCOUNTER — Telehealth: Payer: Self-pay | Admitting: Pharmacist

## 2023-01-12 ENCOUNTER — Other Ambulatory Visit (HOSPITAL_COMMUNITY): Payer: Self-pay

## 2023-01-12 MED ORDER — MOUNJARO 5 MG/0.5ML ~~LOC~~ SOAJ: 5.0000 mg | SUBCUTANEOUS | 1 refills | Status: DC

## 2023-01-12 NOTE — Telephone Encounter (Signed)
Patient in Liberate CGM study calls and requests additional sensors.   Review of CGM report show worsened control most likely due to discontinuation of Ozempic (semaglutide).   Patient reports adherence to other therapies.   After review of therapy options it appears that Mounjaro (tirzepatide) - is covered on her insurance.  Patient agreeable to switch to tirzepatide.   Plan to start with 5mg  weekly dose given minimal GI side effects from previous GLP use and tolerated semaglutide 2mg  in the past.   New prescription Mounjaro provided.   Sensors placed at front desk for pick-up tomorrow 01/13/23

## 2023-01-12 NOTE — Telephone Encounter (Signed)
Reviewed and agree with Dr Koval's plan.   

## 2023-01-25 ENCOUNTER — Other Ambulatory Visit: Payer: Self-pay | Admitting: Family Medicine

## 2023-01-25 DIAGNOSIS — R809 Proteinuria, unspecified: Secondary | ICD-10-CM

## 2023-01-25 DIAGNOSIS — E7800 Pure hypercholesterolemia, unspecified: Secondary | ICD-10-CM

## 2023-01-26 ENCOUNTER — Other Ambulatory Visit (HOSPITAL_COMMUNITY): Payer: Self-pay

## 2023-01-26 ENCOUNTER — Ambulatory Visit: Payer: BC Managed Care – PPO | Admitting: Pharmacist

## 2023-01-26 ENCOUNTER — Encounter: Payer: Self-pay | Admitting: Pharmacist

## 2023-01-26 VITALS — BP 128/74 | HR 78 | Wt 182.6 lb

## 2023-01-26 DIAGNOSIS — E119 Type 2 diabetes mellitus without complications: Secondary | ICD-10-CM | POA: Diagnosis not present

## 2023-01-26 LAB — POCT GLYCOSYLATED HEMOGLOBIN (HGB A1C): HbA1c, POC (controlled diabetic range): 9.6 % — AB (ref 0.0–7.0)

## 2023-01-26 NOTE — Progress Notes (Signed)
S:     Chief Complaint  Patient presents with   Medication Management    Liberate 3 month follow up   52 y.o. female who presents for diabetes evaluation, education, and management in the context of the LIBERATE Study.  PMH is significant for T2DM and HLD.  Patient was referred and last seen by Primary Care Provider, Dr. Pollie Meyer, on 04/2022.   Ozempic was recently discontinued due to insurance affordability issues and transitioned to [facility].   Today, patient arrives in good spirits and presents without any assistance. She reports that Greggory Keen was expensive on her insurance and she has not started this. She has not been on GLP-1 therapy in 2+ months.    Patient reports Diabetes was diagnosed in 2016.   Current diabetes medications include: metformin 1000 mg BID, Farxiga (dapagliflozin) 10 mg daily Current hyperlipidemia medications include: atorvastatin 20 mg daily  Patient reports adherence to taking all medications as prescribed.   Do you have any problems obtaining medications due to transportation or finances? Yes - has had issues obtaining Mounjaro due to insurance affordability Insurance coverage: [employer]  Patient denies hypoglycemic events.   O:   Review of Systems  All other systems reviewed and are negative.   Physical Exam Constitutional:      Appearance: Normal appearance.  Pulmonary:     Effort: Pulmonary effort is normal.  Abdominal:     Tenderness: There is no abdominal tenderness.  Neurological:     Mental Status: She is alert.  Psychiatric:        Mood and Affect: Mood normal.        Behavior: Behavior normal.        Thought Content: Thought content normal.        Judgment: Judgment normal.     Freestyle Libre 3 CGM Download:  % Time CGM is active: 54% Average Glucose: 196 mg/dL Glucose Management Indicator: 8.0  Glucose Variability: 35.7% (goal <36%) Time in Goal:  - Time in range 70-180: 43% - Time  above range: 56%  - Time below range: 1%   Lab Results  Component Value Date   HGBA1C 9.6 (A) 01/26/2023     Vitals:   01/26/23 1541  BP: 128/74  Pulse: 78  SpO2: 100%     Lipid Panel     Component Value Date/Time   CHOL 126 11/23/2021 1022   TRIG 89 11/23/2021 1022   HDL 42 11/23/2021 1022   CHOLHDL 3.0 11/23/2021 1022   CHOLHDL 4.4 12/22/2015 1102   VLDL 15 12/22/2015 1102   LDLCALC 67 11/23/2021 1022   LDLDIRECT 163 (H) 11/12/2013 0931    Clinical Atherosclerotic Cardiovascular Disease (ASCVD): No  The ASCVD Risk score (Arnett DK, et al., 2019) failed to calculate for the following reasons:   The valid total cholesterol range is 130 to 320 mg/dL   A/P:  LIBERATE Study:  - 8 sensors provided for a 3 month supply. Educated to contact the office if the sensor falls off early and replacements are needed before their next [employer].  Diabetes longstanding currently uncontrolled. Patient is able to verbalize appropriate hypoglycemia management plan. Medication adherence appears optimal, aside from [facility]. Control is suboptimal due to inability to obtain medications. Verified within [facility] that [facility] with her insurance and the copay card is ~$50. -Started GLP-1 Mounjaro (generic tirzepatide) 5 mg daily -Continued SGLT2i Farxiga (generic dapagliflozin) 10 daily -Continued metformin 1000 mg BID  -Patient educated on purpose,  proper use, and potential adverse effects of medications.  -Extensively discussed pathophysiology of diabetes, recommended lifestyle interventions, dietary effects on blood sugar control.  -Counseled on s/sx of and management of hypoglycemia.  -Next A1c anticipated 04/2023.   ASCVD risk - primary prevention in patient with diabetes. Last LDL is 67 at goal of <70 mg/dL.  -Continued atorvastatin 20 mg.    Written patient instructions provided. Patient verbalized understanding of treatment plan.  Total time in face to face counseling 26  minutes.    Follow-up:  Pharmacist 02/28/23. Patient seen with Lily Peer, PharmD, PGY-1 resident.  Marland Kitchen

## 2023-01-26 NOTE — Patient Instructions (Signed)
It was nice to see you today!  Your goal blood sugar is 80-130 before eating and less than 180 after eating.  Medication Changes: Continue Farxiga 10 mg daily and metformin 1000 mg (2 tablets) twice daily  Begin Mounjaro 5 mg weekly  Monitor blood sugars at home and keep a log (glucometer or piece of paper) to bring with you to your next visit.  Keep up the good work with diet and exercise. Aim for a diet full of vegetables, fruit and lean meats (chicken, [place], fish). Try to limit salt intake by eating fresh or frozen vegetables (instead of canned), rinse canned vegetables prior to cooking and do not add any additional salt to meals.

## 2023-01-26 NOTE — Assessment & Plan Note (Signed)
LIBERATE Study:  - 8 sensors provided for a 3 month supply. Educated to contact the office if the sensor falls off early and replacements are needed before their next [employer].  Diabetes longstanding currently uncontrolled. Patient is able to verbalize appropriate hypoglycemia management plan. Medication adherence appears optimal, aside from [facility] Control is suboptimal due to inability to obtain medications. Verified within [facility] that [facility] with her insurance and the copay card is ~$50. -Started GLP-1 Mounjaro (generic tirzepatide) 5 mg daily -Continued SGLT2i Farxiga (generic dapagliflozin) 10 daily -Continued metformin 1000 mg BID  -Patient educated on purpose, proper use, and potential adverse effects of medications.  -Extensively discussed pathophysiology of diabetes, recommended lifestyle interventions, dietary effects on blood sugar control.  -Counseled on s/sx of and management of hypoglycemia.

## 2023-01-27 ENCOUNTER — Telehealth: Payer: Self-pay

## 2023-01-27 NOTE — Progress Notes (Signed)
Reviewed and agree with Dr Koval's plan.   

## 2023-01-27 NOTE — Telephone Encounter (Signed)
Patient calls nurse line to report [facility] has no stock of Mounjaro 5mg .   She reports she was told to call back to see if we could send the medication to a community pharmacy.   I called [facility] and they do report stock of Mounjaro 5mg .   Will forward to ordering provider.

## 2023-01-30 ENCOUNTER — Other Ambulatory Visit (HOSPITAL_COMMUNITY): Payer: Self-pay

## 2023-01-30 MED ORDER — MOUNJARO 5 MG/0.5ML ~~LOC~~ SOAJ
5.0000 mg | SUBCUTANEOUS | 1 refills | Status: DC
  Filled 2023-01-30 (×2): qty 2, 28d supply, fill #0
  Filled 2023-02-22: qty 2, 28d supply, fill #1

## 2023-01-30 NOTE — Telephone Encounter (Signed)
Attempted phone contact to share plan for medication.   Message left on Voice Mail.  New Rx for medication in need to [facility].   Asked patient to pick up later today.

## 2023-01-30 NOTE — Telephone Encounter (Signed)
Reviewed and agree with Dr Koval's plan.   

## 2023-02-22 ENCOUNTER — Other Ambulatory Visit (HOSPITAL_COMMUNITY): Payer: Self-pay

## 2023-02-28 ENCOUNTER — Ambulatory Visit (INDEPENDENT_AMBULATORY_CARE_PROVIDER_SITE_OTHER): Payer: BC Managed Care – PPO | Admitting: Pharmacist

## 2023-02-28 ENCOUNTER — Encounter: Payer: Self-pay | Admitting: Pharmacist

## 2023-02-28 VITALS — BP 122/74 | HR 79 | Ht 65.5 in | Wt 179.0 lb

## 2023-02-28 DIAGNOSIS — E7800 Pure hypercholesterolemia, unspecified: Secondary | ICD-10-CM | POA: Diagnosis not present

## 2023-02-28 DIAGNOSIS — E119 Type 2 diabetes mellitus without complications: Secondary | ICD-10-CM | POA: Diagnosis not present

## 2023-02-28 NOTE — Progress Notes (Signed)
S:     Chief Complaint  Patient presents with   Medication Management    Diabetes management - Liberate Follow-up   52 y.o. female who presents for diabetes evaluation, education, and management. Patient arrives in good spirits and presents without any assistance.   Patient was referred and last seen by Primary Care Provider, Dr. Pollie Meyer, on 04/14/2022.   PMH is significant for T2DM and HLD.  At last visit, Mounjaro 5 mg was restarted after confirming affordability of $50 copay .   Patient reports Diabetes was diagnosed in 2016.   Current diabetes medications include: Mounjaro (tirzepatide) 5 mg once weekly, Farxiga (dapagliflozin) 10 mg daily, metformin 1000 mg once daily Current hypertension medications include: diltiazem XR 120 mg daily  Patient reports adherence to taking all medications as prescribed.   Do you feel that your medications are working for you? yes Have you been experiencing any side effects to the medications prescribed? no Do you have any problems obtaining medications due to transportation or finances? no Insurance coverage: [employer]  Patient denies hypoglycemic events.  Patient-reported exercise habits: walking 3 days per week   O:   Review of Systems  All other systems reviewed and are negative.   Physical Exam Constitutional:      Appearance: Normal appearance.  Pulmonary:     Effort: Pulmonary effort is normal.  Neurological:     Mental Status: She is alert.  Psychiatric:        Mood and Affect: Mood normal.        Behavior: Behavior normal.        Thought Content: Thought content normal.        Judgment: Judgment normal.     Libre3 CGM Download:  % Time CGM is active: 95% Average Glucose: 152 mg/dL Glucose Management Indicator: 6.9  Glucose Variability: 32.4 (goal <36%) Time in Goal:  - Time in range 70-180: 77% - Time above range: 23% - Time below range: 0% Observed patterns:   Lab Results  Component Value Date   HGBA1C 9.6  (A) 01/26/2023   Vitals:   02/28/23 1609  BP: 122/74  Pulse: 79  SpO2: 100%    Lipid Panel     Component Value Date/Time   CHOL 126 11/23/2021 1022   TRIG 89 11/23/2021 1022   HDL 42 11/23/2021 1022   CHOLHDL 3.0 11/23/2021 1022   CHOLHDL 4.4 12/22/2015 1102   VLDL 15 12/22/2015 1102   LDLCALC 67 11/23/2021 1022   LDLDIRECT 163 (H) 11/12/2013 0931    Clinical Atherosclerotic Cardiovascular Disease (ASCVD): No  The ASCVD Risk score (Arnett DK, et al., 2019) failed to calculate for the following reasons:   The valid total cholesterol range is 130 to 320 mg/dL   Patient is participating in a Managed Medicaid Plan:  No  A/P:  Diabetes longstanding currently uncontrolled, but significantly improved since last visit. Suspect A1c to be at goal on next check based on GMI of 6.9 since restarting Mounjaro. Patient is able to verbalize appropriate hypoglycemia management plan. Medication adherence appears optimized with lower Mounjaro copay (~$50). -Continued GLP-1  Mounjaro (tirzepatide) 5 mg once weekly -Continued SGLT2-I Farxiga (dapagliflozin) 10 mg once daily. Counseled on sick day rules. -Continued metformin 1000 mg once daily.  -Patient educated on purpose, proper use, and potential adverse effects.  -Extensively discussed pathophysiology of diabetes, recommended lifestyle interventions, dietary effects on blood sugar control.  -Counseled on s/sx of and management of hypoglycemia.  -Next A1c anticipated ~ 2  months.   ASCVD risk - primary  prevention in patient with diabetes. Last LDL is 67 at goal of <70 mg/dL. moderate intensity statin indicated.  -Continued atorvastatin 20 mg.  - Lipid panel due at next visit   Written patient instructions provided. Patient verbalized understanding of treatment plan.  Total time in face to face counseling 24 minutes.    Follow-up:  Pharmacist in 2 months. PCP clinic visit in 1 month.  Patient seen with  Adam Phenix, PharmD, PGY-1  Pharmacy Resident.

## 2023-02-28 NOTE — Patient Instructions (Signed)
It was nice to see you today!  Your goal blood sugar is 80-130 before eating and less than 180 after eating.  Medication Changes: Continue Mounjaro 5 mg once weekly  Continue metformin 1000 mg once daily  Continue Farxiga 10 mg daily  Continue using Libre3 continuous glucose monitor.  Monitor blood sugars at home and keep a log (glucometer or piece of paper) to bring with you to your next visit.  Keep up the good work with diet and exercise. Aim for a diet full of vegetables, fruit and lean meats (chicken, [place], fish). Try to limit salt intake by eating fresh or frozen vegetables (instead of canned), rinse canned vegetables prior to cooking and do not add any additional salt to meals.

## 2023-02-28 NOTE — Assessment & Plan Note (Signed)
ASCVD risk - primary  prevention in patient with diabetes. Last LDL is 67 at goal of <70 mg/dL. moderate intensity statin indicated.  -Continued atorvastatin 20 mg.  - Lipid panel due at next visit

## 2023-02-28 NOTE — Assessment & Plan Note (Signed)
Diabetes longstanding currently uncontrolled, but significantly improved since last visit. Suspect A1c to be at goal on next check based on GMI of 6.9 since restarting Mounjaro. Patient is able to verbalize appropriate hypoglycemia management plan. Medication adherence appears optimized with lower Mounjaro copay (~$50). -Continued GLP-1  Mounjaro (tirzepatide) 5 mg once weekly -Continued SGLT2-I Farxiga (dapagliflozin) 10 mg once daily. Counseled on sick day rules. -Continued metformin 1000 mg once daily.  -Patient educated on purpose, proper use, and potential adverse effects.  -Extensively discussed pathophysiology of diabetes, recommended lifestyle interventions, dietary effects on blood sugar control.  -Counseled on s/sx of and management of hypoglycemia.

## 2023-03-01 NOTE — Progress Notes (Signed)
Reviewed and agree with Dr Koval's plan.   

## 2023-03-23 ENCOUNTER — Other Ambulatory Visit (HOSPITAL_COMMUNITY): Payer: Self-pay

## 2023-03-23 ENCOUNTER — Other Ambulatory Visit: Payer: Self-pay | Admitting: Family Medicine

## 2023-03-23 MED ORDER — MOUNJARO 5 MG/0.5ML ~~LOC~~ SOAJ
5.0000 mg | SUBCUTANEOUS | 1 refills | Status: DC
  Filled 2023-03-23: qty 2, 28d supply, fill #0

## 2023-04-04 ENCOUNTER — Ambulatory Visit: Payer: BC Managed Care – PPO | Admitting: Family Medicine

## 2023-04-04 ENCOUNTER — Encounter: Payer: Self-pay | Admitting: Family Medicine

## 2023-04-04 ENCOUNTER — Other Ambulatory Visit: Payer: Self-pay

## 2023-04-04 VITALS — BP 120/75 | HR 80 | Ht 65.5 in | Wt 176.4 lb

## 2023-04-04 DIAGNOSIS — E119 Type 2 diabetes mellitus without complications: Secondary | ICD-10-CM | POA: Diagnosis not present

## 2023-04-04 DIAGNOSIS — E7800 Pure hypercholesterolemia, unspecified: Secondary | ICD-10-CM | POA: Diagnosis not present

## 2023-04-04 DIAGNOSIS — R809 Proteinuria, unspecified: Secondary | ICD-10-CM | POA: Diagnosis not present

## 2023-04-04 DIAGNOSIS — Z23 Encounter for immunization: Secondary | ICD-10-CM | POA: Diagnosis not present

## 2023-04-04 MED ORDER — MOUNJARO 7.5 MG/0.5ML ~~LOC~~ SOAJ
7.5000 mg | SUBCUTANEOUS | 2 refills | Status: DC
Start: 2023-04-04 — End: 2023-05-02

## 2023-04-04 NOTE — Assessment & Plan Note (Signed)
Doing well on diltiazem XR.  Unable to take ACE or ARB due to history of angioedema with ACE inhibitor.  Update urine microalbumin today.

## 2023-04-04 NOTE — Assessment & Plan Note (Signed)
Update lipids today.  Continue statin. 

## 2023-04-04 NOTE — Assessment & Plan Note (Signed)
Per Dr. Macky Lower note and estimation of CGM readings, anticipate A1c will be controlled at next check in 1 month.  Patient desires to increase Mounjaro to 7.5 mg daily.  I think this is reasonable and will help with weight loss.  Sent in prescription for higher dose.  Follow-up in 1 months with Dr. Raymondo Band as scheduled.

## 2023-04-04 NOTE — Progress Notes (Signed)
  Date of Visit: 04/04/2023   SUBJECTIVE:   HPI:  Marisa Davis presents today for routine follow-up.  Diabetes: Currently taking farxiga 10 mg daily, metformin XR 1000 mg daily, and Mounjaro 5 mg weekly.  Tolerating these well.  Denies any low blood sugars.  She would like to go up on the [facility] to 7.5 mg.  Hyperlipidemia: Currently taking Lipitor 20 mg daily.  Tolerating this well.  Is fasting currently.  Urine microalbuminuria: Currently taking diltiazem XR 120 mg daily due to history of angioedema with ACE inhibitor.  Tolerating this medicine well.     OBJECTIVE:   BP 120/75   Pulse 80   Ht 5' 5.5" (1.664 m)   Wt 176 lb 6.4 oz (80 kg)   LMP 09/29/2014   SpO2 100%   BMI 28.91 kg/m  Gen: No acute distress, pleasant, cooperative HEENT: Normocephalic, atraumatic Heart: Regular rate and rhythm, no murmur Lungs: Clear to auscultation bilaterally, normal effort Neuro: Grossly nonfocal, speech normal Ext: Normal diabetic foot exam today  ASSESSMENT/PLAN:   Health maintenance:  -Foot exam done today -Advised to schedule eye exam-goes to [facility] -Urine microalbumin obtained today -Tdap updated today  DM2 (diabetes mellitus, type 2) (HCC) Per Dr. Macky Lower note and estimation of CGM readings, anticipate A1c will be controlled at next check in 1 month.  Patient desires to increase Mounjaro to 7.5 mg daily.  I think this is reasonable and will help with weight loss.  Sent in prescription for higher dose.  Follow-up in 1 months with Dr. Raymondo Band as scheduled.  HLD (hyperlipidemia) Update lipids today.  Continue statin.  Moderately increased albuminuria Doing well on diltiazem XR.  Unable to take ACE or ARB due to history of angioedema with ACE inhibitor.  Update urine microalbumin today.  FOLLOW UP: Follow up in 1 month for follow-up with Dr. Isabel Caprice. Pollie Meyer, MD [facility]

## 2023-04-04 NOTE — Patient Instructions (Signed)
It was great to see you again today.  Go up on mounjaro to 7.5mg  weekly - sent this in. Schedule your eye exam  Checking labs and urine today Tdap today Follow up with Dr. Raymondo Band in 1 month   Be well, Dr. Pollie Meyer

## 2023-04-05 LAB — BASIC METABOLIC PANEL
BUN/Creatinine Ratio: 18 (ref 9–23)
BUN: 13 mg/dL (ref 6–24)
CO2: 24 mmol/L (ref 20–29)
Calcium: 9.6 mg/dL (ref 8.7–10.2)
Chloride: 105 mmol/L (ref 96–106)
Creatinine, Ser: 0.71 mg/dL (ref 0.57–1.00)
Glucose: 136 mg/dL — ABNORMAL HIGH (ref 70–99)
Potassium: 4.3 mmol/L (ref 3.5–5.2)
Sodium: 142 mmol/L (ref 134–144)
eGFR: 102 mL/min/{1.73_m2} (ref >59)

## 2023-04-05 LAB — MICROALBUMIN / CREATININE URINE RATIO
Creatinine, Urine: 78.7 mg/dL
Microalb/Creat Ratio: 75 mg/g{creat} — ABNORMAL HIGH (ref 0–29)
Microalbumin, Urine: 58.7 ug/mL

## 2023-04-05 LAB — LIPID PANEL
Chol/HDL Ratio: 2.9 ratio (ref 0.0–4.4)
Cholesterol, Total: 118 mg/dL (ref 100–199)
HDL: 41 mg/dL (ref >39)
LDL Chol Calc (NIH): 63 mg/dL (ref 0–99)
Triglycerides: 67 mg/dL (ref 0–149)
VLDL Cholesterol Cal: 14 mg/dL (ref 5–40)

## 2023-05-02 ENCOUNTER — Encounter: Payer: BC Managed Care – PPO | Admitting: Pharmacist

## 2023-05-02 ENCOUNTER — Encounter: Payer: Self-pay | Admitting: Pharmacist

## 2023-05-02 VITALS — BP 135/87 | HR 87 | Ht 66.0 in | Wt 179.8 lb

## 2023-05-02 DIAGNOSIS — E119 Type 2 diabetes mellitus without complications: Secondary | ICD-10-CM

## 2023-05-02 LAB — POCT GLYCOSYLATED HEMOGLOBIN (HGB A1C): HbA1c, POC (controlled diabetic range): 8.2 % — AB (ref 0.0–7.0)

## 2023-05-02 MED ORDER — FREESTYLE LIBRE 3 SENSOR MISC
1.0000 | 11 refills | Status: DC
Start: 2023-05-02 — End: 2023-06-20

## 2023-05-02 MED ORDER — MOUNJARO 10 MG/0.5ML ~~LOC~~ SOAJ: 10.0000 mg | SUBCUTANEOUS | 5 refills | Status: DC

## 2023-05-02 NOTE — Assessment & Plan Note (Signed)
Diabetes longstanding currently uncontrolled with today's A1c of 8.2% and GMI of 7.2%, much improved from A1c in 01/2023 of 9.6%. Patient is able to verbalize appropriate hypoglycemia management plan. Medication adherence appears good.  - Continued Farxiga (dapagliflozin) 10 mg once weekly. - Continued metformin XR 1000 mg once daily. - Increased Mounjaro (tirzepitide) from 7.5 to 10 mg once weekly. Instructed patient to finish current 2 week supply of 7.5 mg prior to starting 10 mg dose. -Patient educated on purpose, proper use, and potential adverse effects of increased dose of Mounjaro (tirzepitide). -Extensively discussed pathophysiology of diabetes, recommended lifestyle interventions, dietary effects on blood sugar control.

## 2023-05-02 NOTE — Research (Signed)
S:     Chief Complaint  Patient presents with   Medication Management    Liberate 6 month visit   52 y.o. female who presents for diabetes evaluation, education, and management in the context of the LIBERATE Study.  PMH is significant for T2DM and HLD.  Patient was referred and last seen by Primary Care Provider, Dr. Pollie Meyer, on 04/04/23.  At last visit with Dr. Pollie Meyer, increased dose of Mounjaro to 7.5 mg once weekly.   Today, patient arrives in good spirits and presents without any assistance.   Patient reports Diabetes was diagnosed in 2016.   Family/Social History: strong family history of diabetes  Current diabetes medications include: metformin XR 500 1000 mg once daily, Mounjaro (tirzepitide) 7.5 mg once weekly, Farxiga (dapagliflozin) 10 mg once daily Current hypertension medications include: Dilt-XR (diltiazem) 120 mg once daily Current hyperlipidemia medications include: atorvastatin 20 mg once daily  Patient reports adherence to taking all medications as prescribed.   Do you feel that your medications are working for you? yes Have you been experiencing any side effects to the medications prescribed? no  Insurance coverage: BCBS  Patient denies hypoglycemic events.  Patient denies nocturia (nighttime urination).  Patient denies neuropathy (nerve pain). Patient denies visual changes. Patient reports self foot exams.   Patient-reported exercise habits: walking 3 days per week   O:   Review of Systems  Gastrointestinal:  Positive for constipation (bloating).  All other systems reviewed and are negative.   Physical Exam Pulmonary:     Effort: Pulmonary effort is normal.  Neurological:     Mental Status: She is alert.  Psychiatric:        Mood and Affect: Mood normal.        Behavior: Behavior normal.        Thought Content: Thought content normal.        Judgment: Judgment normal.      Lab Results  Component Value Date   HGBA1C 8.2 (A)  05/02/2023    Vitals:   05/02/23 1555  BP: 135/87  Pulse: 87  SpO2: 100%     Lipid Panel     Component Value Date/Time   CHOL 118 04/04/2023 1149   TRIG 67 04/04/2023 1149   HDL 41 04/04/2023 1149   CHOLHDL 2.9 04/04/2023 1149   CHOLHDL 4.4 12/22/2015 1102   VLDL 15 12/22/2015 1102   LDLCALC 63 04/04/2023 1149   LDLDIRECT 163 (H) 11/12/2013 0931    Clinical Atherosclerotic Cardiovascular Disease (ASCVD): No    A/P:  LIBERATE Study:  Patient is interested in continuing use of Libre 3 sensors. - Sent a script for sensors to patient's pharmacy.  Diabetes longstanding currently uncontrolled with today's A1c of 8.2% and GMI of 7.2%, much improved from A1c in 01/2023 of 9.6%. Patient is able to verbalize appropriate hypoglycemia management plan. Medication adherence appears good.  - Continued Farxiga (dapagliflozin) 10 mg once weekly. - Continued metformin XR 1000 mg once daily. - Increased Mounjaro (tirzepitide) from 7.5 to 10 mg once weekly. Instructed patient to finish current 2 week supply of 7.5 mg prior to starting 10 mg dose. -Patient educated on purpose, proper use, and potential adverse effects of increased dose of Mounjaro (tirzepitide). -Extensively discussed pathophysiology of diabetes, recommended lifestyle interventions, dietary effects on blood sugar control.  -Counseled on s/sx of and management of hypoglycemia.   ASCVD risk - primary prevention in patient with diabetes. Last LDL is 63 at goal of <70 mg/dL.  -Continued  atorvastatin 20 mg once daily.  No diagnosis of hypertension in Epic to date. Blood pressure is elevated with in-office reading of 135/87 mm Hg. Blood pressure goal of <130/80 mmHg. Medication adherence good. Blood pressure control is suboptimal due to potential need for starting blood pressure-lowering therapy. - Continued Dilt-XR (diltiazem) 120 mg once daily. - Discuss starting hydrochlorothiazide at next PCP visit.  Written patient  instructions provided. Patient verbalized understanding of treatment plan.  Total time in face to face counseling 29 minutes.    Follow-up:  Pharmacist visit PRN. PCP clinic visit on 06/20/23.  Patient seen with Andee Poles, PharmD Candidate. Marland Kitchen

## 2023-05-02 NOTE — Patient Instructions (Addendum)
It was nice to see you today!  Your goal blood sugar is 80-130 before eating and less than 180 after eating.  Medication Changes:  Increase Mounjaro (tirzepitide) to 10 mg once weekly.  We have sent a script for the [place] 3 sensors to your pharmacy.  Continue all other medication the same.   Keep up the good work with diet and exercise. Aim for a diet full of vegetables, fruit and lean meats (chicken, [place], fish). Try to limit salt intake by eating fresh or frozen vegetables (instead of canned), rinse canned vegetables prior to cooking and do not add any additional salt to meals.

## 2023-05-25 ENCOUNTER — Other Ambulatory Visit: Payer: Self-pay

## 2023-05-25 DIAGNOSIS — R809 Proteinuria, unspecified: Secondary | ICD-10-CM

## 2023-05-25 DIAGNOSIS — E119 Type 2 diabetes mellitus without complications: Secondary | ICD-10-CM

## 2023-05-25 DIAGNOSIS — E7800 Pure hypercholesterolemia, unspecified: Secondary | ICD-10-CM

## 2023-05-25 NOTE — Telephone Encounter (Signed)
Patient calls nurse line requesting refills on "all of my medications."  She needs 90 day supply of medications to be sent to [facility].   Pended to this encounter.   Veronda Prude, RN

## 2023-05-30 MED ORDER — DAPAGLIFLOZIN PROPANEDIOL 10 MG PO TABS
10.0000 mg | ORAL_TABLET | Freq: Every day | ORAL | 3 refills | Status: DC
Start: 2023-05-30 — End: 2023-06-20

## 2023-05-30 MED ORDER — MOUNJARO 10 MG/0.5ML ~~LOC~~ SOAJ
10.0000 mg | SUBCUTANEOUS | 5 refills | Status: DC
Start: 2023-05-30 — End: 2023-06-20

## 2023-05-30 MED ORDER — DILTIAZEM HCL ER 120 MG PO CP24
120.0000 mg | ORAL_CAPSULE | Freq: Every day | ORAL | 3 refills | Status: DC
Start: 2023-05-30 — End: 2023-07-26

## 2023-05-30 MED ORDER — METFORMIN HCL ER 500 MG PO TB24: 1000.0000 mg | ORAL_TABLET | Freq: Every day | ORAL | 3 refills | Status: DC

## 2023-05-30 MED ORDER — CETIRIZINE HCL 10 MG PO TABS: 10.0000 mg | ORAL_TABLET | Freq: Every day | ORAL | 1 refills | Status: DC

## 2023-05-30 MED ORDER — ATORVASTATIN CALCIUM 20 MG PO TABS
20.0000 mg | ORAL_TABLET | Freq: Every day | ORAL | 1 refills | Status: DC
Start: 2023-05-30 — End: 2023-12-04

## 2023-06-14 ENCOUNTER — Other Ambulatory Visit: Payer: Self-pay | Admitting: Family Medicine

## 2023-06-17 ENCOUNTER — Other Ambulatory Visit (HOSPITAL_COMMUNITY): Payer: Self-pay

## 2023-06-20 ENCOUNTER — Other Ambulatory Visit (HOSPITAL_COMMUNITY): Payer: Self-pay

## 2023-06-20 ENCOUNTER — Encounter: Payer: Self-pay | Admitting: Family Medicine

## 2023-06-20 ENCOUNTER — Ambulatory Visit: Payer: BC Managed Care – PPO | Admitting: Family Medicine

## 2023-06-20 ENCOUNTER — Other Ambulatory Visit: Payer: Self-pay | Admitting: Family Medicine

## 2023-06-20 VITALS — BP 123/80 | HR 81 | Ht 66.0 in | Wt 183.8 lb

## 2023-06-20 DIAGNOSIS — R809 Proteinuria, unspecified: Secondary | ICD-10-CM | POA: Diagnosis not present

## 2023-06-20 DIAGNOSIS — E7800 Pure hypercholesterolemia, unspecified: Secondary | ICD-10-CM

## 2023-06-20 DIAGNOSIS — E119 Type 2 diabetes mellitus without complications: Secondary | ICD-10-CM

## 2023-06-20 DIAGNOSIS — Z0000 Encounter for general adult medical examination without abnormal findings: Secondary | ICD-10-CM

## 2023-06-20 LAB — POCT GLYCOSYLATED HEMOGLOBIN (HGB A1C): HbA1c, POC (controlled diabetic range): 9.7 % — AB (ref 0.0–7.0)

## 2023-06-20 MED ORDER — DAPAGLIFLOZIN PROPANEDIOL 10 MG PO TABS: 10.0000 mg | ORAL_TABLET | Freq: Every day | ORAL | 3 refills | Status: DC

## 2023-06-20 MED ORDER — MOUNJARO 10 MG/0.5ML ~~LOC~~ SOAJ: 10.0000 mg | SUBCUTANEOUS | 5 refills | Status: DC

## 2023-06-20 MED ORDER — FREESTYLE LIBRE 3 SENSOR MISC: 1.0000 | 11 refills | Status: DC

## 2023-06-20 NOTE — Patient Instructions (Signed)
It was great to see you again today.  Refilled medications & Marisa Davis Will see if they need a prior authorization Follow up in 3 months for next A1c  Schedule your diabetic eye exam  Be well, Dr. Pollie Meyer

## 2023-06-20 NOTE — Progress Notes (Unsigned)
    SUBJECTIVE:   CHIEF COMPLAINT / HPI:   Diabetes: Taking Metformin every day. Need refill of Mounjaro and Farxiga. She feels that she needs the [place] for diabetes tracking which she received from the research study is completed. She needs a pre-authorization from doctor.   Hyperlipidemia:  Takes lipitor daily and tolerates well.   Urine microalbuminuria: currently take dilitiazem XR 120 mg daily.    OBJECTIVE:   BP 123/80   Pulse 81   Ht 5\' 6"  (1.676 m)   Wt 183 lb 12.8 oz (83.4 kg)   LMP 09/29/2014   SpO2 100%   BMI 29.67 kg/m   Physical Exam Constitutional:      Comments: Well-appearing and cooperative  HENT:     Head: Normocephalic and atraumatic.  Cardiovascular:     Rate and Rhythm: Normal rate and regular rhythm.     Pulses: Normal pulses.     Heart sounds: Normal heart sounds.  Pulmonary:     Effort: Pulmonary effort is normal.     Breath sounds: Normal breath sounds.  Skin:    Capillary Refill: Capillary refill takes less than 2 seconds.  Neurological:     Mental Status: She is alert.  Psychiatric:        Mood and Affect: Mood normal.        Behavior: Behavior normal.      ASSESSMENT/PLAN:   No problem-specific Assessment & Plan notes found for this encounter.   Health maintenance: Encouraged patient to follow up with an eye exam for her annual diabetes health maintenance. She is amenable to this.  DM2: Uncontrolled diabetes after being out of her farxiga and Greggory Keen for a month. HA1C increased to 9.7. Have reordered her Marcelline Deist and Greggory Keen, as well as placed an order for her Libre strips.   HLD: Will continue lipitor.   Moderately increased albuminuria: Will continue dilitazem.    Oscar La, Medical Student [facility]

## 2023-06-22 DIAGNOSIS — Z0000 Encounter for general adult medical examination without abnormal findings: Secondary | ICD-10-CM | POA: Insufficient documentation

## 2023-06-22 NOTE — Assessment & Plan Note (Signed)
Uncontrolled diabetes after being out of her farxiga and Greggory Keen for a month. HA1C increased to 9.7. Have reordered her Marcelline Deist and Greggory Keen, as well as placed an order for her Libre strips. Will send msg to pharmacy team about PA requirements.

## 2023-06-22 NOTE — Assessment & Plan Note (Signed)
Advised to schedule eye exam Declines COVID and flu vaccines today

## 2023-06-22 NOTE — Assessment & Plan Note (Signed)
Continue dilitiazem

## 2023-06-22 NOTE — Assessment & Plan Note (Signed)
Continue statin. 

## 2023-06-27 ENCOUNTER — Other Ambulatory Visit (HOSPITAL_COMMUNITY): Payer: Self-pay

## 2023-06-30 ENCOUNTER — Other Ambulatory Visit (HOSPITAL_COMMUNITY): Payer: Self-pay

## 2023-07-04 ENCOUNTER — Other Ambulatory Visit (HOSPITAL_COMMUNITY): Payer: Self-pay

## 2023-07-04 ENCOUNTER — Telehealth: Payer: Self-pay | Admitting: Pharmacist

## 2023-07-04 MED ORDER — FREESTYLE LIBRE 3 PLUS SENSOR MISC: 11 refills | Status: AC

## 2023-07-04 NOTE — Telephone Encounter (Signed)
Libre 3 + prescription provided.  PA in process.   Pharmacist will communicate availability to patient.

## 2023-07-04 NOTE — Telephone Encounter (Signed)
-----   Message from Otho Najjar sent at 06/30/2023  3:18 PM EST ----- Regarding: FW: prior auths  ----- Message ----- From: Otho Najjar, CPhT Sent: 06/27/2023  10:50 AM EST To: Latrelle Dodrill, MD Subject: RE: prior Leonides Grills out to pharmacy.  Marcelline Deist was mailed to patient at end of Oct, next refill 08/05/23 Greggory Keen was on hold. Pharmacy filling, copay $25. Freestyle Libre 3 PLUS will need PA. I will initiate (pharmacy changed to PLUS due to [facility] 3 being discontinued). ----- Message ----- From: Latrelle Dodrill, MD Sent: 06/22/2023   5:37 PM EST To: [facility] Subject: prior auths                                    Could you check and see if prior auths are needed for this patient's Clare Gandy, and farxiga?  She had some trouble getting them.  Thanks! Latrelle Dodrill, MD

## 2023-07-26 ENCOUNTER — Other Ambulatory Visit: Payer: Self-pay | Admitting: Family Medicine

## 2023-07-26 DIAGNOSIS — R809 Proteinuria, unspecified: Secondary | ICD-10-CM

## 2023-10-26 ENCOUNTER — Telehealth: Payer: Self-pay | Admitting: Family Medicine

## 2023-10-26 NOTE — Telephone Encounter (Signed)
 Patient was identified as falling into the [place] Measure - Diabetes.   Patient was: Appointment scheduled with primary care provider in the next 30 days.

## 2023-11-21 ENCOUNTER — Other Ambulatory Visit: Payer: Self-pay | Admitting: Family Medicine

## 2023-12-04 ENCOUNTER — Encounter: Payer: Self-pay | Admitting: Family Medicine

## 2023-12-04 ENCOUNTER — Ambulatory Visit: Admitting: Family Medicine

## 2023-12-04 VITALS — BP 124/81 | HR 86 | Ht 66.0 in | Wt 162.6 lb

## 2023-12-04 DIAGNOSIS — R809 Proteinuria, unspecified: Secondary | ICD-10-CM | POA: Diagnosis not present

## 2023-12-04 DIAGNOSIS — E119 Type 2 diabetes mellitus without complications: Secondary | ICD-10-CM | POA: Diagnosis not present

## 2023-12-04 DIAGNOSIS — Z7985 Long-term (current) use of injectable non-insulin antidiabetic drugs: Secondary | ICD-10-CM | POA: Diagnosis not present

## 2023-12-04 DIAGNOSIS — E7800 Pure hypercholesterolemia, unspecified: Secondary | ICD-10-CM | POA: Diagnosis not present

## 2023-12-04 LAB — POCT GLYCOSYLATED HEMOGLOBIN (HGB A1C): HbA1c, POC (controlled diabetic range): 8.1 % — AB (ref 0.0–7.0)

## 2023-12-04 MED ORDER — DILTIAZEM HCL ER 120 MG PO CP24: 120.0000 mg | ORAL_CAPSULE | Freq: Every day | ORAL | 1 refills | Status: DC

## 2023-12-04 MED ORDER — METFORMIN HCL ER 500 MG PO TB24: 1000.0000 mg | ORAL_TABLET | Freq: Every day | ORAL | 3 refills | Status: DC

## 2023-12-04 MED ORDER — MOUNJARO 12.5 MG/0.5ML ~~LOC~~ SOAJ: 12.5000 mg | SUBCUTANEOUS | 1 refills | Status: DC

## 2023-12-04 MED ORDER — DAPAGLIFLOZIN PROPANEDIOL 10 MG PO TABS: 10.0000 mg | ORAL_TABLET | Freq: Every day | ORAL | 3 refills | Status: AC

## 2023-12-04 MED ORDER — ATORVASTATIN CALCIUM 20 MG PO TABS: 20.0000 mg | ORAL_TABLET | Freq: Every day | ORAL | 1 refills | Status: DC

## 2023-12-04 MED ORDER — CETIRIZINE HCL 10 MG PO TABS: 10.0000 mg | ORAL_TABLET | Freq: Every day | ORAL | 1 refills | Status: DC

## 2023-12-04 NOTE — Assessment & Plan Note (Signed)
 A1c remains slightly elevated today at 8.1. Increase Mounjaro  to 12.5 mg weekly.  Continue to take farxiga  and metformin  at present doses Follow-up in 3 months for next A1c Advised to schedule eye exam.

## 2023-12-04 NOTE — Assessment & Plan Note (Signed)
 Doing well, continue diltiazem 

## 2023-12-04 NOTE — Patient Instructions (Signed)
 It was great to see you again today.  Increase mounjaro  to 12.5mg  weekly Schedule eye exam [facility] Follow up in 3 months, sooner if needed  Be well, Dr. Dawn Eth

## 2023-12-04 NOTE — Assessment & Plan Note (Signed)
 Doing well, continue atorvastatin 

## 2023-12-04 NOTE — Progress Notes (Signed)
  Date of Visit: 12/04/2023   SUBJECTIVE:   HPI:  Marisa Davis presents today for routine follow-up of diabetes.  Diabetes: Currently taking farxiga  10 mg daily, metformin  XR 1000 mg daily, and Mounjaro  10 mg once weekly.  Tolerating these medications well.  A1c today is 8.1.  Last eye exam was over 1 year ago.   OBJECTIVE:   BP 124/81   Pulse 86   Ht 5\' 6"  (1.676 m)   Wt 162 lb 9.6 oz (73.8 kg)   LMP 09/29/2014   SpO2 100%   BMI 26.24 kg/m  Gen: No acute distress, pleasant, cooperative, well-appearing HEENT: Normocephalic, atraumatic Lungs: Normal work of breathing on room air Neuro: Grossly nonfocal, speech normal Ext: No edema  ASSESSMENT/PLAN:   Assessment & Plan Type 2 diabetes mellitus without complication, without long-term current use of insulin  (HCC) A1c remains slightly elevated today at 8.1. Increase Mounjaro  to 12.5 mg weekly.  Continue to take farxiga  and metformin  at present doses Follow-up in 3 months for next A1c Advised to schedule eye exam. Moderately increased albuminuria Doing well, continue diltiazem  Pure hypercholesterolemia Doing well, continue atorvastatin     FOLLOW UP: Follow up in 3 mos for diabetes/A1c  [place] J. Dawn Eth, MD [facility]

## 2024-02-07 DIAGNOSIS — E1165 Type 2 diabetes mellitus with hyperglycemia: Secondary | ICD-10-CM | POA: Diagnosis not present

## 2024-02-07 DIAGNOSIS — H35033 Hypertensive retinopathy, bilateral: Secondary | ICD-10-CM | POA: Diagnosis not present

## 2024-02-07 DIAGNOSIS — H524 Presbyopia: Secondary | ICD-10-CM | POA: Diagnosis not present

## 2024-02-07 DIAGNOSIS — H2513 Age-related nuclear cataract, bilateral: Secondary | ICD-10-CM | POA: Diagnosis not present

## 2024-02-07 LAB — HM DIABETES EYE EXAM

## 2024-04-08 ENCOUNTER — Other Ambulatory Visit: Payer: Self-pay | Admitting: Family Medicine

## 2024-04-08 DIAGNOSIS — E7800 Pure hypercholesterolemia, unspecified: Secondary | ICD-10-CM

## 2024-04-08 DIAGNOSIS — E119 Type 2 diabetes mellitus without complications: Secondary | ICD-10-CM

## 2024-04-11 MED ORDER — MOUNJARO 12.5 MG/0.5ML ~~LOC~~ SOAJ: 12.5000 mg | SUBCUTANEOUS | 1 refills | Status: AC

## 2024-05-07 ENCOUNTER — Other Ambulatory Visit: Payer: Self-pay | Admitting: Family Medicine

## 2024-05-07 DIAGNOSIS — R809 Proteinuria, unspecified: Secondary | ICD-10-CM

## 2024-05-08 NOTE — Telephone Encounter (Signed)
Please let patient know I am refilling this medication, but she needs to schedule an appointment with me.   Thanks, Leeanne Rio, MD

## 2024-06-03 ENCOUNTER — Ambulatory Visit: Admitting: Family Medicine

## 2024-06-03 ENCOUNTER — Encounter: Payer: Self-pay | Admitting: Family Medicine

## 2024-06-03 VITALS — BP 126/82 | HR 95 | Ht 66.0 in | Wt 153.2 lb

## 2024-06-03 DIAGNOSIS — Z0000 Encounter for general adult medical examination without abnormal findings: Secondary | ICD-10-CM | POA: Diagnosis not present

## 2024-06-03 DIAGNOSIS — R809 Proteinuria, unspecified: Secondary | ICD-10-CM | POA: Diagnosis not present

## 2024-06-03 DIAGNOSIS — Z0184 Encounter for antibody response examination: Secondary | ICD-10-CM

## 2024-06-03 DIAGNOSIS — E785 Hyperlipidemia, unspecified: Secondary | ICD-10-CM | POA: Diagnosis not present

## 2024-06-03 DIAGNOSIS — B353 Tinea pedis: Secondary | ICD-10-CM

## 2024-06-03 DIAGNOSIS — E119 Type 2 diabetes mellitus without complications: Secondary | ICD-10-CM | POA: Diagnosis not present

## 2024-06-03 DIAGNOSIS — Z1231 Encounter for screening mammogram for malignant neoplasm of breast: Secondary | ICD-10-CM

## 2024-06-03 DIAGNOSIS — T148XXA Other injury of unspecified body region, initial encounter: Secondary | ICD-10-CM

## 2024-06-03 LAB — POCT GLYCOSYLATED HEMOGLOBIN (HGB A1C): HbA1c, POC (controlled diabetic range): 6.5 % (ref 0.0–7.0)

## 2024-06-03 MED ORDER — CLOTRIMAZOLE 1 % EX CREA: 1.0000 | TOPICAL_CREAM | Freq: Two times a day (BID) | CUTANEOUS | 0 refills | Status: AC

## 2024-06-03 NOTE — Patient Instructions (Addendum)
 It was great to see you again today.  Continue present medications. A1c is great!  Checking labs today - kidneys, cholesterol, hepatitis B immunity  For foot fungus, use clotrimazole cream twice daily for 1 week  For your trapezius muscle strain, try voltaren gel, ice and heat Let me know if not improving, could send to physical therapy  To schedule your mammogram, call the [facility] at [phone]. They are located at [address].  Be well, Dr. Donah

## 2024-06-03 NOTE — Progress Notes (Signed)
"  °  Date of Visit: 06/03/2024   SUBJECTIVE:   HPI:  Discussed the use of AI scribe software for clinical note transcription with the patient, who gave verbal consent to proceed.  History of Present Illness Marisa Davis is a 53 year old female with diabetes who presents for a follow-up visit.  Diabetes mellitus management - Significant improvement in glycemic control with current A1c at 6.5, decreased from 8.1 six months ago - Improvement followed an increase in Mounjaro  dosage - Current diabetes medications: Farxiga  10 mg daily, metformin  XR 1000 mg daily (500 mg twice a day), Mounjaro  12.5 mg weekly - No significant changes in appetite or weight  Musculoskeletal pain - Strained muscle in upper back/lower neck with radiation of pain into L shoulder - Symptoms ongoing for approximately two weeks - Possible etiology related to sleeping in an awkward position - No specific treatments attempted  Cutaneous symptoms - Itching between toes, attributed to possible fungal infection - No specific treatment initiated at home  Gynecologic health maintenance - Last Pap smear in May 2023 was normal - No new sexual partners since last Pap smear - Inquires about timing for next Pap smear - No concerns requiring earlier screening  Immunization status - Has not received current season's influenza vaccine but is willing to receive it - Declines COVID vaccine - open to checking immunity status for Hep B vaccine   OBJECTIVE:   BP 126/82   Pulse 95   Ht 5' 6 (1.676 m)   Wt 153 lb 3.2 oz (69.5 kg)   LMP 09/29/2014   SpO2 100%   BMI 24.73 kg/m  Gen: no acute distress, pleasant cooperative HEENT: normocephalic, atraumatic. + tenderness of posterior trapezius muscle on L side of neck/upper back, into L shoulder Heart: regular rate and rhythm, no murmur Lungs: clear to auscultation bilaterally, normal work of breathing  Neuro: alert, grossly nonfocal, speech normal Ext:  Tinea pedis on  left foot between third and fourth toes. 2+ dp pulses bilaterally. Normal monofilament exam bilaterally  ASSESSMENT/PLAN:   Assessment & Plan Type 2 diabetes mellitus without complication, without long-term current use of insulin  (HCC) Diabetes well-controlled with A1c at 6.5%, improved from 8.1% six months ago. Current regimen effective for diabetes and appetite control. - Continue Farxiga  10 mg daily. - Continue Metformin  XR 1000 mg daily. - Continue Mounjaro  12.5 mg once weekly. - Check kidney function and protein level in urine. Routine adult health maintenance Mammogram ordered & scheduling instructions given Declines COVID vaccine Hep B titer ordered Moderately increased albuminuria Check UACR today, continue diltiazem  as had angioedema with ACEI Hyperlipidemia, unspecified hyperlipidemia type Cholesterol management ongoing with atorvastatin . - Continue atorvastatin  20 mg daily. - Order blood work to check cholesterol levels. Muscle strain Muscle strain likely involving trapezius muscle, symptoms persistent for two weeks. Prefers non-pharmacological management. - Recommend alternating ice and heat application. - Suggest using over-the-counter Voltaren gel. - recommend gentle stretching - Consider massage therapy, follow up if not improving as could refer to physical therapy  Tinea pedis, unspecified laterality Mild tinea pedis between third and fourth toes with occasional itching. - Recommend over-the-counter clotrimazole  cream. - Send prescription for clotrimazole  to pharmacy.   FOLLOW UP: Follow up in 6 mos for above issues  Marisa Dumas J. Donah, MD [facility] "

## 2024-06-04 ENCOUNTER — Ambulatory Visit: Payer: Self-pay | Admitting: Family Medicine

## 2024-06-04 LAB — BASIC METABOLIC PANEL WITH GFR
BUN/Creatinine Ratio: 21 (ref 9–23)
BUN: 13 mg/dL (ref 6–24)
CO2: 21 mmol/L (ref 20–29)
Calcium: 9.4 mg/dL (ref 8.7–10.2)
Chloride: 108 mmol/L — ABNORMAL HIGH (ref 96–106)
Creatinine, Ser: 0.63 mg/dL (ref 0.57–1.00)
Glucose: 83 mg/dL (ref 70–99)
Potassium: 4.1 mmol/L (ref 3.5–5.2)
Sodium: 143 mmol/L (ref 134–144)
eGFR: 105 mL/min/1.73 (ref >59)

## 2024-06-04 LAB — LIPID PANEL
Chol/HDL Ratio: 2.7 ratio (ref 0.0–4.4)
Cholesterol, Total: 99 mg/dL — ABNORMAL LOW (ref 100–199)
HDL: 37 mg/dL — ABNORMAL LOW (ref >39)
LDL Chol Calc (NIH): 48 mg/dL (ref 0–99)
Triglycerides: 59 mg/dL (ref 0–149)
VLDL Cholesterol Cal: 14 mg/dL (ref 5–40)

## 2024-06-04 LAB — HEPATITIS B SURFACE ANTIBODY, QUANTITATIVE: Hepatitis B Surf Ab Quant: 7.3 m[IU]/mL — ABNORMAL LOW

## 2024-06-04 LAB — MICROALBUMIN / CREATININE URINE RATIO
Creatinine, Urine: 157.2 mg/dL
Microalb/Creat Ratio: 101 mg/g{creat} — ABNORMAL HIGH (ref 0–29)
Microalbumin, Urine: 158.1 ug/mL

## 2024-06-05 NOTE — Assessment & Plan Note (Signed)
 Mammogram ordered & scheduling instructions given Declines COVID vaccine Hep B titer ordered

## 2024-06-05 NOTE — Assessment & Plan Note (Signed)
 Cholesterol management ongoing with atorvastatin . - Continue atorvastatin  20 mg daily. - Order blood work to check cholesterol levels.

## 2024-06-05 NOTE — Assessment & Plan Note (Signed)
 Diabetes well-controlled with A1c at 6.5%, improved from 8.1% six months ago. Current regimen effective for diabetes and appetite control. - Continue Farxiga  10 mg daily. - Continue Metformin  XR 1000 mg daily. - Continue Mounjaro  12.5 mg once weekly. - Check kidney function and protein level in urine.

## 2024-06-05 NOTE — Assessment & Plan Note (Signed)
 Check UACR today, continue diltiazem  as had angioedema with ACEI

## 2024-06-25 ENCOUNTER — Other Ambulatory Visit: Payer: Self-pay | Admitting: Family Medicine

## 2024-07-06 ENCOUNTER — Other Ambulatory Visit: Payer: Self-pay | Admitting: Family Medicine

## 2024-07-11 ENCOUNTER — Other Ambulatory Visit: Payer: Self-pay | Admitting: Family Medicine

## 2024-07-11 DIAGNOSIS — E7800 Pure hypercholesterolemia, unspecified: Secondary | ICD-10-CM
